# Patient Record
Sex: Male | Born: 1960 | Race: Black or African American | Hispanic: No | Marital: Married | Smoking: Light tobacco smoker
Health system: Southern US, Community
[De-identification: ages and names within clinical notes are randomized; demographics above are authoritative.]

---

## 2006-01-31 ENCOUNTER — Emergency Department (HOSPITAL_COMMUNITY): Admission: EM | Admit: 2006-01-31 | Discharge: 2006-01-31 | Payer: Self-pay | Admitting: Family Medicine

## 2011-01-21 ENCOUNTER — Ambulatory Visit (HOSPITAL_COMMUNITY)
Admission: RE | Admit: 2011-01-21 | Discharge: 2011-01-21 | Payer: Self-pay | Source: Home / Self Care | Attending: Family Medicine | Admitting: Family Medicine

## 2011-09-27 ENCOUNTER — Ambulatory Visit (INDEPENDENT_AMBULATORY_CARE_PROVIDER_SITE_OTHER): Payer: Self-pay

## 2011-09-27 ENCOUNTER — Inpatient Hospital Stay (INDEPENDENT_AMBULATORY_CARE_PROVIDER_SITE_OTHER)
Admission: RE | Admit: 2011-09-27 | Discharge: 2011-09-27 | Disposition: A | Discharge status: Home / Self Care | Payer: Self-pay | Source: Ambulatory Visit | Attending: Family Medicine | Admitting: Family Medicine

## 2011-09-27 DIAGNOSIS — IMO0002 Reserved for concepts with insufficient information to code with codable children: Secondary | ICD-10-CM

## 2013-07-07 ENCOUNTER — Ambulatory Visit: Payer: Self-pay | Attending: Family Medicine | Admitting: Family Medicine

## 2013-07-07 VITALS — BP 143/78 | HR 73 | Temp 98.8°F | Resp 16 | Ht 74.0 in | Wt 225.0 lb

## 2013-07-07 DIAGNOSIS — R030 Elevated blood-pressure reading, without diagnosis of hypertension: Secondary | ICD-10-CM

## 2013-07-07 DIAGNOSIS — Z111 Encounter for screening for respiratory tuberculosis: Secondary | ICD-10-CM | POA: Insufficient documentation

## 2013-07-07 DIAGNOSIS — Z0000 Encounter for general adult medical examination without abnormal findings: Secondary | ICD-10-CM

## 2013-07-07 DIAGNOSIS — Z0001 Encounter for general adult medical examination with abnormal findings: Secondary | ICD-10-CM | POA: Insufficient documentation

## 2013-07-07 DIAGNOSIS — K219 Gastro-esophageal reflux disease without esophagitis: Secondary | ICD-10-CM

## 2013-07-07 NOTE — Progress Notes (Signed)
Patient presents for TB skin test for work.

## 2013-07-07 NOTE — Progress Notes (Signed)
Patient ID: Antonio Snow, male   DOB: 21-Mar-1961, 52 y.o.   MRN: [id number]  CC: need a TB test   HPI: Pt is presenting and requesting that she have a TB skin test done.   No Known Allergies History reviewed. No pertinent past medical history. No current outpatient prescriptions on file prior to visit.   No current facility-administered medications on file prior to visit.   History reviewed. No pertinent family history. History   Social History  . Marital Status: Married    Spouse Name: N/A    Number of Children: N/A  . Years of Education: N/A   Occupational History  . Not on file.   Social History Main Topics  . Smoking status: Light Tobacco Smoker -- 0.25 packs/day    Types: Cigarettes  . Smokeless tobacco: Not on file  . Alcohol Use: No  . Drug Use: No  . Sexually Active: Not on file   Other Topics Concern  . Not on file   Social History Narrative  . No narrative on file    Review of Systems  Constitutional: Negative for fever, chills, diaphoresis, activity change, appetite change and fatigue.  HENT: Negative for ear pain, nosebleeds, congestion, facial swelling, rhinorrhea, neck pain, neck stiffness and ear discharge.   Eyes: Negative for pain, discharge, redness, itching and visual disturbance.  Respiratory: Negative for cough, choking, chest tightness, shortness of breath, wheezing and stridor.   Cardiovascular: Negative for chest pain, palpitations and leg swelling.  Gastrointestinal: Negative for abdominal distention.  Genitourinary: Negative for dysuria, urgency, frequency, hematuria, flank pain, decreased urine volume, difficulty urinating and dyspareunia.  Musculoskeletal: Negative for back pain, joint swelling, arthralgias and gait problem.  Neurological: Negative for dizziness, tremors, seizures, syncope, facial asymmetry, speech difficulty, weakness, light-headedness, numbness and headaches.  Hematological: Negative for adenopathy. Does not  bruise/bleed easily.  Psychiatric/Behavioral: Negative for hallucinations, behavioral problems, confusion, dysphoric mood, decreased concentration and agitation.    Objective:   Filed Vitals:   07/07/13 1512  BP: 143/78  Pulse: 73  Temp: 98.8 F (37.1 C)  Resp: 16    Physical Exam  Constitutional: Appears well-developed and well-nourished. No distress.  HENT: Normocephalic. External right and left ear normal. Oropharynx is clear and moist.  Eyes: Conjunctivae and EOM are normal. PERRLA, no scleral icterus.  Neck: Normal ROM. Neck supple. No JVD. No tracheal deviation. No thyromegaly.  CVS: RRR, S1/S2 +, no murmurs, no gallops, no carotid bruit.  Pulmonary: Effort and breath sounds normal, no stridor, rhonchi, wheezes, rales.  Abdominal: Soft. BS +,  no distension, tenderness, rebound or guarding.  Musculoskeletal: Normal range of motion. No edema and no tenderness.  Lymphadenopathy: No lymphadenopathy noted, cervical, inguinal. Neuro: Alert. Normal reflexes, muscle tone coordination. No cranial nerve deficit. Skin: Skin is warm and dry. No rash noted. Not diaphoretic. No erythema. No pallor.  Psychiatric: Normal mood and affect. Behavior, judgment, thought content normal.   No results found for this basename: WBC, HGB, HCT, MCV, PLT   No results found for this basename: CREATININE, BUN, NA, K, CL, CO2    No results found for this basename: HGBA1C   Lipid Panel  No results found for this basename: chol, trig, hdl, cholhdl, vldl, ldlcalc       Assessment and plan:   Patient Active Problem List   Diagnosis Date Noted  . Elevated blood pressure 07/07/2013  . GERD (gastroesophageal reflux disease) 07/07/2013     PPD  Colonoscopy recommended for health maintenance (  will try to arrange )  Hemoccult cards given to return to office    Set up for CPE   The patient was given clear instructions to go to ER or return to medical center if symptoms don't improve, worsen or  new problems develop.  The patient verbalized understanding.  The patient was told to call to get any lab results if not heard anything in the next week.    Rodney Langton, MD, CDE, FAAFP [facility] [place], [place]

## 2013-07-08 NOTE — Patient Instructions (Addendum)

## 2013-07-09 ENCOUNTER — Ambulatory Visit (HOSPITAL_COMMUNITY)
Admission: RE | Admit: 2013-07-09 | Discharge: 2013-07-09 | Disposition: A | Discharge status: Home / Self Care | Payer: Self-pay | Source: Ambulatory Visit | Attending: Family Medicine | Admitting: Family Medicine

## 2013-07-09 ENCOUNTER — Ambulatory Visit: Payer: Self-pay | Attending: Family Medicine

## 2013-07-09 DIAGNOSIS — R7611 Nonspecific reaction to tuberculin skin test without active tuberculosis: Secondary | ICD-10-CM | POA: Insufficient documentation

## 2013-07-09 LAB — TB SKIN TEST: TB Skin Test: POSITIVE

## 2013-07-09 NOTE — Progress Notes (Signed)
Pt here for PPD reading in right arm. positve read noted. 1mm red,raised. Pt to get chest xray f/u

## 2013-07-09 NOTE — Progress Notes (Signed)
Quick Note:  Please inform patient that chest xray results reveal: No active cardiopulmonary disease. No change from prior xrays.   Rodney Langton, MD, CDE, FAAFP [facility] [place], [place]   ______

## 2013-07-12 ENCOUNTER — Telehealth: Payer: Self-pay | Admitting: *Deleted

## 2013-07-12 NOTE — Telephone Encounter (Signed)
07/12/13 Patient made aware of  chest X-ray results reveal no active cardiopulmonary disease.  Sherlene Shams BSN [facility]

## 2013-09-15 ENCOUNTER — Encounter: Payer: Self-pay | Admitting: Family Medicine

## 2016-08-05 ENCOUNTER — Ambulatory Visit (HOSPITAL_COMMUNITY)
Admission: EM | Admit: 2016-08-05 | Discharge: 2016-08-05 | Disposition: A | Discharge status: Home / Self Care | Payer: 59 | Attending: Family Medicine | Admitting: Family Medicine

## 2016-08-05 ENCOUNTER — Encounter (HOSPITAL_COMMUNITY): Payer: Self-pay | Admitting: Emergency Medicine

## 2016-08-05 DIAGNOSIS — K047 Periapical abscess without sinus: Secondary | ICD-10-CM

## 2016-08-05 MED ORDER — CEFTRIAXONE SODIUM 1 G IJ SOLR
1.0000 g | Freq: Once | INTRAMUSCULAR | Status: AC
  Administered 2016-08-05: 1 g via INTRAMUSCULAR

## 2016-08-05 MED ORDER — HYDROCODONE-ACETAMINOPHEN 5-325 MG PO TABS: 2.0000 | ORAL_TABLET | ORAL | 0 refills | Status: DC | PRN

## 2016-08-05 MED ORDER — CLINDAMYCIN HCL 300 MG PO CAPS: 300.0000 mg | ORAL_CAPSULE | Freq: Three times a day (TID) | ORAL | 0 refills | Status: DC

## 2016-08-05 MED ORDER — CEFTRIAXONE SODIUM 1 G IJ SOLR
INTRAMUSCULAR | Status: AC
  Filled 2016-08-05: qty 10

## 2016-08-05 MED ORDER — LIDOCAINE HCL (PF) 1 % IJ SOLN
INTRAMUSCULAR | Status: AC
  Filled 2016-08-05: qty 2

## 2016-08-05 NOTE — ED Triage Notes (Signed)
Patient has toothache for 2 weeks.  Patient has been using salt water rinses.

## 2016-08-05 NOTE — ED Provider Notes (Signed)
CSN: [id number]     Arrival date & time 08/05/16  1806 History   None    No chief complaint on file.  (Consider location/radiation/quality/duration/timing/severity/associated sxs/prior Treatment) Patient is having dental pain and abscess.  He is having low grade fever.   The history is provided by the patient.  Dental Pain  Location:  Upper Upper teeth location:  9/LU central incisor and 8/RU central incisor Quality:  Pulsating Severity:  Severe Onset quality:  Sudden Duration:  2 days Timing:  Constant Progression:  Worsening Chronicity:  New Context: abscess   Relieved by:  Nothing Worsened by:  Nothing Associated symptoms: fever     No past medical history on file. No past surgical history on file. No family history on file. Social History  Substance Use Topics  . Smoking status: Light Tobacco Smoker    Packs/day: 0.25    Types: Cigarettes  . Smokeless tobacco: Not on file  . Alcohol use No    Review of Systems  Constitutional: Positive for fever.  HENT: Positive for dental problem.   Eyes: Negative.   Respiratory: Negative.   Cardiovascular: Negative.   Gastrointestinal: Negative.   Endocrine: Negative.   Genitourinary: Negative.   Musculoskeletal: Negative.   Skin: Negative.   Neurological: Negative.   Hematological: Negative.   Psychiatric/Behavioral: Negative.     Allergies  Review of patient's allergies indicates no known allergies.  Home Medications   Prior to Admission medications   Not on File   Meds Ordered and Administered this Visit  Medications - No data to display  BP 138/64 (BP Location: Right Arm)   Pulse 84   Temp 100.1 F (37.8 C) (Oral)   SpO2 97%  No data found.   Physical Exam  Constitutional: He appears well-developed and well-nourished.  HENT:  Head: Normocephalic and atraumatic.  Upper left central incisor with abcess and draining purulence.  Eyes: EOM are normal. Pupils are equal, round, and reactive to light.   Neck: Normal range of motion. Neck supple.  Cardiovascular: Normal rate and regular rhythm.   Pulmonary/Chest: Effort normal and breath sounds normal.  Abdominal: Soft. Bowel sounds are normal.  Nursing note and vitals reviewed.   Urgent Care Course   Clinical Course    Procedures (including critical care time)  Labs Review Labs Reviewed - No data to display  Imaging Review No results found.   Visual Acuity Review  Right Eye Distance:   Left Eye Distance:   Bilateral Distance:    Right Eye Near:   Left Eye Near:    Bilateral Near:         MDM  Dental abscess - Rocephin 1 gram IM, norco 5/325 one po q 6 hours prn #6 Clindamycin 300mg  one po tid x 10 days #30    Deatra CanterWilliam J Olisa Quesnel, FNP 08/05/16 1946

## 2018-01-26 ENCOUNTER — Encounter: Payer: Self-pay | Admitting: Family Medicine

## 2018-01-26 ENCOUNTER — Ambulatory Visit (INDEPENDENT_AMBULATORY_CARE_PROVIDER_SITE_OTHER): Payer: Self-pay | Admitting: Family Medicine

## 2018-01-26 VITALS — BP 110/80 | HR 68 | Ht 74.0 in | Wt 233.4 lb

## 2018-01-26 DIAGNOSIS — B356 Tinea cruris: Secondary | ICD-10-CM

## 2018-01-26 DIAGNOSIS — B351 Tinea unguium: Secondary | ICD-10-CM

## 2018-01-26 DIAGNOSIS — R351 Nocturia: Secondary | ICD-10-CM

## 2018-01-26 DIAGNOSIS — K921 Melena: Secondary | ICD-10-CM | POA: Insufficient documentation

## 2018-01-26 DIAGNOSIS — Z0001 Encounter for general adult medical examination with abnormal findings: Secondary | ICD-10-CM

## 2018-01-26 DIAGNOSIS — B353 Tinea pedis: Secondary | ICD-10-CM

## 2018-01-26 DIAGNOSIS — M25561 Pain in right knee: Secondary | ICD-10-CM

## 2018-01-26 DIAGNOSIS — B352 Tinea manuum: Secondary | ICD-10-CM

## 2018-01-26 DIAGNOSIS — N401 Enlarged prostate with lower urinary tract symptoms: Secondary | ICD-10-CM

## 2018-01-26 DIAGNOSIS — G8929 Other chronic pain: Secondary | ICD-10-CM

## 2018-01-26 LAB — URINALYSIS, ROUTINE W REFLEX MICROSCOPIC
BILIRUBIN URINE: NEGATIVE
KETONES UR: NEGATIVE
Leukocytes, UA: NEGATIVE
Nitrite: NEGATIVE
PH: 6 (ref 5.0–8.0)
Specific Gravity, Urine: 1.02 (ref 1.000–1.030)
Total Protein, Urine: NEGATIVE
UROBILINOGEN UA: 0.2 (ref 0.0–1.0)
Urine Glucose: NEGATIVE

## 2018-01-26 LAB — LIPID PANEL
Cholesterol: 145 mg/dL (ref 0–200)
HDL: 56.4 mg/dL (ref >39.00)
LDL CALC: 75 mg/dL (ref 0–99)
NonHDL: 88.43
Total CHOL/HDL Ratio: 3
Triglycerides: 67 mg/dL (ref 0.0–149.0)
VLDL: 13.4 mg/dL (ref 0.0–40.0)

## 2018-01-26 LAB — COMPREHENSIVE METABOLIC PANEL
ALBUMIN: 4.2 g/dL (ref 3.5–5.2)
ALK PHOS: 52 U/L (ref 39–117)
ALT: 17 U/L (ref 0–53)
AST: 18 U/L (ref 0–37)
BUN: 17 mg/dL (ref 6–23)
CHLORIDE: 106 meq/L (ref 96–112)
CO2: 29 mEq/L (ref 19–32)
Calcium: 9.2 mg/dL (ref 8.4–10.5)
Creatinine, Ser: 1.11 mg/dL (ref 0.40–1.50)
GFR: 87.82 mL/min (ref >60.00)
Glucose, Bld: 110 mg/dL — ABNORMAL HIGH (ref 70–99)
POTASSIUM: 4.3 meq/L (ref 3.5–5.1)
Sodium: 143 mEq/L (ref 135–145)
TOTAL PROTEIN: 7.1 g/dL (ref 6.0–8.3)
Total Bilirubin: 0.4 mg/dL (ref 0.2–1.2)

## 2018-01-26 LAB — CBC
HEMATOCRIT: 41 % (ref 39.0–52.0)
HEMOGLOBIN: 13.4 g/dL (ref 13.0–17.0)
MCHC: 32.8 g/dL (ref 30.0–36.0)
MCV: 90.2 fl (ref 78.0–100.0)
Platelets: 233 10*3/uL (ref 150.0–400.0)
RBC: 4.54 Mil/uL (ref 4.22–5.81)
RDW: 13.1 % (ref 11.5–15.5)
WBC: 4.6 10*3/uL (ref 4.0–10.5)

## 2018-01-26 LAB — TSH: TSH: 0.76 u[IU]/mL (ref 0.35–4.50)

## 2018-01-26 LAB — PSA: PSA: 2.38 ng/mL (ref 0.10–4.00)

## 2018-01-26 MED ORDER — TERBINAFINE HCL 250 MG PO TABS: 250.0000 mg | ORAL_TABLET | Freq: Every day | ORAL | 1 refills | Status: DC

## 2018-01-26 MED ORDER — KETOCONAZOLE 2 % EX CREA: TOPICAL_CREAM | CUTANEOUS | 1 refills | Status: DC

## 2018-01-26 NOTE — Progress Notes (Signed)
Subjective:  Patient ID: Antonio Snow, male    DOB: May 20, 1961  Age: 57 y.o. MRN: [id number]  CC: Establish Care   HPI Antonio Snow presents for establishment of care, physical exam and has several complaints.  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  He is engaged.  He smokes 1-2 cigarettes daily and has an occasional cigar.  Rarely drinks alcohol and does not use illicit drugs.  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  His mother is 95.  She lives independently.  His father is past and had prostate cancer.  Patient is having a rash in his groin, toenails are tender, and he has noticed some blood in his stool.  He had a colonoscopy when he was 48 and was asked to return in 10 years.  He has some urinary hesitancy and nocturia x4.  He admits to drinking a lot of water before bedtime.  His right knee has been sore for some time now.  History Antonio Snow has no past medical history on file.   He has no past surgical history on file.   His family history includes Cancer in his father.He reports that he has been smoking cigarettes.  He has been smoking about 0.25 packs per day. he has never used smokeless tobacco. He reports that he does not drink alcohol or use drugs.  Outpatient Medications Prior to Visit  Medication Sig Dispense Refill  . clindamycin (CLEOCIN) 300 MG capsule Take 1 capsule (300 mg total) by mouth 3 (three) times daily. 30 capsule 0  . HYDROcodone-acetaminophen (NORCO/VICODIN) 5-325 MG tablet Take 2 tablets by mouth every 4 (four) hours as needed. 6 tablet 0   No facility-administered medications prior to visit.     ROS Review of Systems  Constitutional: Negative.   HENT: Negative.   Eyes: Negative for photophobia and visual disturbance.  Respiratory: Negative for cough, shortness of breath and wheezing.   Cardiovascular: Negative.   Gastrointestinal: Positive for blood in stool. Negative for abdominal pain, anal bleeding, constipation, nausea  and vomiting.  Endocrine: Negative for polyphagia and polyuria.  Genitourinary: Positive for difficulty urinating. Negative for dysuria and hematuria.  Musculoskeletal: Positive for arthralgias.  Skin: Positive for color change and rash.  Allergic/Immunologic: Negative for immunocompromised state.  Neurological: Negative for weakness, numbness and headaches.  Hematological: Does not bruise/bleed easily.  Psychiatric/Behavioral: Negative.     Objective:  BP 110/80 (BP Location: Left Arm, Patient Position: Sitting, Cuff Size: Normal)   Pulse 68   Ht 6\' 2"  (1.88 m)   Wt 233 lb 6 oz (105.9 kg)   SpO2 99%   BMI 29.96 kg/m   Physical Exam  Constitutional: He is oriented to person, place, and time. He appears well-developed and well-nourished. No distress.  HENT:  Head: Normocephalic and atraumatic.  Right Ear: External ear normal.  Left Ear: External ear normal.  Mouth/Throat: Oropharynx is clear and moist. No oropharyngeal exudate.  Eyes: Conjunctivae are normal. Pupils are equal, round, and reactive to light. Right eye exhibits no discharge. Left eye exhibits no discharge. No scleral icterus.  Neck: Neck supple. No JVD present. No tracheal deviation present. No thyromegaly present.  Cardiovascular: Normal rate, regular rhythm and normal heart sounds.  Pulmonary/Chest: Effort normal and breath sounds normal. No respiratory distress. He has no wheezes. He has no rales.  Abdominal: Bowel sounds are normal. He exhibits no distension. There is no tenderness. There is no rebound and no guarding. Hernia confirmed  negative in the right inguinal area and confirmed negative in the left inguinal area.  Genitourinary: Testes normal and penis normal. Rectal exam shows external hemorrhoid. Rectal exam shows no internal hemorrhoid, no fissure, no mass, no tenderness, anal tone normal and guaiac negative stool. Prostate is enlarged. Prostate is not tender. Right testis shows no mass, no swelling and no  tenderness. Left testis shows no mass, no swelling and no tenderness. Circumcised. No penile erythema or penile tenderness. No discharge found.  Musculoskeletal:       Right knee: He exhibits swelling.  Lymphadenopathy:    He has no cervical adenopathy.       Right: No inguinal adenopathy present.       Left: No inguinal adenopathy present.  Neurological: He is alert and oriented to person, place, and time.  Skin: Skin is warm and dry. He is not diaphoretic.  Psychiatric: He has a normal mood and affect.      Assessment & Plan:   Antonio Snow was seen today for establish care.  Diagnoses and all orders for this visit:  Encounter for health maintenance examination with abnormal findings -     CBC -     Comprehensive metabolic panel -     Hepatitis C antibody -     HIV antibody -     Lipid panel -     PSA -     TSH -     Urinalysis, Routine w reflex microscopic  Tinea manuum, pedis, and unguium -     Comprehensive metabolic panel -     terbinafine (LAMISIL) 250 MG tablet; Take 1 tablet (250 mg total) by mouth daily.  Tinea cruris -     ketoconazole (NIZORAL) 2 % cream; Apply to rash on groin daily for 2 weeks.  Chronic pain of right knee  Hematochezia -     CBC -     Ambulatory referral to Gastroenterology  BPH associated with nocturia -     PSA -     Urinalysis, Routine w reflex microscopic   I have discontinued Antonio Snow HYDROcodone-acetaminophen and clindamycin. I am also having him start on terbinafine and ketoconazole.  Meds ordered this encounter  Medications  . terbinafine (LAMISIL) 250 MG tablet    Sig: Take 1 tablet (250 mg total) by mouth daily.    Dispense:  30 tablet    Refill:  1  . ketoconazole (NIZORAL) 2 % cream    Sig: Apply to rash on groin daily for 2 weeks.    Dispense:  30 g    Refill:  1   .  Patient on Lamisil as well for his respective infections.  Urged this patient to go ahead and quit smoking.  Discussed using a medicine for  his luts symptoms but for now I advised him to stop all fluid intake after 8:00 at night.  He will return in a month for follow-up and I will look at his right knee at that time.  Follow-up: Return in about 1 month (around 02/23/2018).  Mliss Sax, MD

## 2018-01-26 NOTE — Patient Instructions (Addendum)
Athlete's Foot Athlete's foot (tinea pedis) is a fungal infection of the skin on the feet. It often occurs on the skin that is between or underneath the toes. It can also occur on the soles of the feet. The infection can spread from person to person (is contagious). What are the causes? Athlete's foot is caused by a fungus. This fungus grows in warm, moist places. Most people get athlete's foot by sharing shower stalls, towels, and wet floors with someone who is infected. Not washing your feet or changing your socks often enough can contribute to athlete's foot. What increases the risk? This condition is more likely to develop in:  Men.  People who have a weak body defense system (immune system).  People who have diabetes.  People who use public showers, such as at a gym.  People who wear heavy-duty shoes, such as Youth worker.  Seasons with warm, humid weather.  What are the signs or symptoms? Symptoms of this condition include:  Itchy areas between the toes or on the soles of the feet.  White, flaky, or scaly areas between the toes or on the soles of the feet.  Very itchy small blisters between the toes or on the soles of the feet.  Small cuts on the skin. These cuts can become infected.  Thick or discolored toenails.  How is this diagnosed? This condition is diagnosed with a medical history and physical exam. Your health care provider may also take a skin or toenail sample to be examined. How is this treated? Treatment for this condition includes antifungal medicines. These may be applied as powders, ointments, or creams. In severe cases, an oral antifungal medicine may be given. Follow these instructions at home:  Apply or take over-the-counter and prescription medicines only as told by your health care provider.  Keep all follow-up visits as told by your health care provider. This is important.  Do not scratch your feet.  Keep your feet dry: ? Wear  cotton or wool socks. Change your socks every day or if they become wet. ? Wear shoes that allow air to circulate, such as sandals or canvas tennis shoes.  Wash and dry your feet: ? Every day or as told by your health care provider. ? After exercising. ? Including the area between your toes.  Do not share towels, nail clippers, or other personal items that touch your feet with others.  If you have diabetes, keep your blood sugar under control. How is this prevented?  Do not share towels.  Wear sandals in wet areas, such as locker rooms and shared showers.  Keep your feet dry: ? Wear cotton or wool socks. Change your socks every day or if they become wet. ? Wear shoes that allow air to circulate, such as sandals or canvas tennis shoes.  Wash and dry your feet after exercising. Pay attention to the area between your toes. Contact a health care provider if:  You have a fever.  You have swelling, soreness, warmth, or redness in your foot.  You are not getting better with treatment.  Your symptoms get worse.  You have new symptoms. This information is not intended to replace advice given to you by your health care provider. Make sure you discuss any questions you have with your health care provider. Document Released: 12/06/2000 Document Revised: 05/16/2016 Document Reviewed: 06/12/2015 Elsevier Interactive Patient Education  2018 [employer]. Terbinafine tablets What is this medicine? TERBINAFINE (TER bin a feen) is an antifungal  medicine. It is used to treat certain kinds of fungal or yeast infections. This medicine may be used for other purposes; ask your health care provider or pharmacist if you have questions. COMMON BRAND NAME(S): Lamisil, Terbinex What should I tell my health care provider before I take this medicine? They need to know if you have any of these conditions: -drink alcoholic beverages -kidney disease -liver disease -an unusual or allergic reaction to  terbinafine, other medicines, foods, dyes, or preservatives -pregnant or trying to get pregnant -breast-feeding How should I use this medicine? Take this medicine by mouth with a full glass of water. Follow the directions on the prescription label. You can take this medicine with food or on an empty stomach. Take your medicine at regular intervals. Do not take your medicine more often than directed. Do not skip doses or stop your medicine early even if you feel better. Do not stop taking except on your doctor's advice. Talk to your pediatrician regarding the use of this medicine in children. Special care may be needed. Overdosage: If you think you have taken too much of this medicine contact a poison control center or emergency room at once. NOTE: This medicine is only for you. Do not share this medicine with others. What if I miss a dose? If you miss a dose, take it as soon as you can. If it is almost time for your next dose, take only that dose. Do not take double or extra doses. What may interact with this medicine? Do not take this medicine with any of the following medications: -thioridazine This medicine may also interact with the following medications: -beta-blockers -caffeine -cimetidine -cyclosporine -medicines for depression, anxiety, or psychotic disturbances -medicines for fungal infections like fluconazole and ketoconazole -medicines for irregular heartbeat like amiodarone, flecainide and propafenone -rifampin -warfarin This list may not describe all possible interactions. Give your health care provider a list of all the medicines, herbs, non-prescription drugs, or dietary supplements you use. Also tell them if you smoke, drink alcohol, or use illegal drugs. Some items may interact with your medicine. What should I watch for while using this medicine? Visit your doctor or health care provider regularly. Tell your doctor right away if you have nausea or vomiting, loss of appetite,  stomach pain on your right upper side, yellow skin, dark urine, light stools, or are over tired. Some fungal infections need many weeks or months of treatment to cure. If you are taking this medicine for a long time, you will need to have important blood work done. What side effects may I notice from receiving this medicine? Side effects that you should report to your doctor or health care professional as soon as possible: -allergic reactions like skin rash or hives, swelling of the face, lips, or tongue -changes in vision -dark urine -fever or infection -general ill feeling or flu-like symptoms -light-colored stools -loss of appetite, nausea -redness, blistering, peeling or loosening of the skin, including inside the mouth -right upper belly pain -unusually weak or tired -yellowing of the eyes or skin Side effects that usually do not require medical attention (report to your doctor or health care professional if they continue or are bothersome): -changes in taste -diarrhea -hair loss -muscle or joint pain -stomach gas -stomach upset This list may not describe all possible side effects. Call your doctor for medical advice about side effects. You may report side effects to FDA at 1-800-FDA-1088. Where should I keep my medicine? Keep out of the reach of  children. Store at room temperature below 25 degrees C (77 degrees F). Protect from light. Throw away any unused medicine after the expiration date. NOTE: This sheet is a summary. It may not cover all possible information. If you have questions about this medicine, talk to your doctor, pharmacist, or health care provider.  2018 Elsevier/Gold Standard (2008-02-19 16:28:07)  Fungal Nail Infection Fungal nail infection is a common fungal infection of the toenails or fingernails. This condition affects toenails more often than fingernails. More than one nail may be infected. The condition can be passed from person to person (is  contagious). What are the causes? This condition is caused by a fungus. Several types of funguses can cause the infection. These funguses are common in moist and warm areas. If your hands or feet come into contact with the fungus, it may get into a crack in your fingernail or toenail and cause the infection. What increases the risk? The following factors may make you more likely to develop this condition:  Being male.  Having diabetes.  Being of older age.  Living with someone who has the fungus.  Walking barefoot in areas where the fungus thrives, such as showers or locker rooms.  Having poor circulation.  Wearing shoes and socks that cause your feet to sweat.  Having athlete's foot.  Having a nail injury or history of a recent nail surgery.  Having psoriasis.  Having a weak body defense system (immune system).  What are the signs or symptoms? Symptoms of this condition include:  A pale spot on the nail.  Thickening of the nail.  A nail that becomes yellow or brown.  A brittle or ragged nail edge.  A crumbling nail.  A nail that has lifted away from the nail bed.  How is this diagnosed? This condition is diagnosed with a physical exam. Your health care provider may take a scraping or clipping from your nail to test for the fungus. How is this treated? Mild infections do not need treatment. If you have significant nail changes, treatment may include:  Oral antifungal medicines. You may need to take the medicine for several weeks or several months, and you may not see the results for a long time. These medicines can cause side effects. Ask your health care provider what problems to watch for.  Antifungal nail polish and nail cream. These may be used along with oral antifungal medicines.  Laser treatment of the nail.  Surgery to remove the nail. This may be needed for the most severe infections.  Treatment takes a long time, and the infection may come  back. Follow these instructions at home: Medicines  Take or apply over-the-counter and prescription medicines only as told by your health care provider.  Ask your health care provider about using over-the-counter mentholated ointment on your nails. Lifestyle   Do not share personal items, such as towels or nail clippers.  Trim your nails often.  Wash and dry your hands and feet every day.  Wear absorbent socks, and change your socks frequently.  Wear shoes that allow air to circulate, such as sandals or canvas tennis shoes. Throw out old shoes.  Wear rubber gloves if you are working with your hands in wet areas.  Do not walk barefoot in shower rooms or locker rooms.  Do not use a nail salon that does not use clean instruments.  Do not use artificial nails. General instructions  Keep all follow-up visits as told by your health care provider. This  is important.  Use antifungal foot powder on your feet and in your shoes. Contact a health care provider if: Your infection is not getting better or it is getting worse after several months. This information is not intended to replace advice given to you by your health care provider. Make sure you discuss any questions you have with your health care provider. Document Released: 12/06/2000 Document Revised: 05/16/2016 Document Reviewed: 06/12/2015 Elsevier Interactive Patient Education  2018 [employer]

## 2018-01-27 LAB — HIV ANTIBODY (ROUTINE TESTING W REFLEX): HIV: NONREACTIVE

## 2018-01-27 LAB — HEPATITIS C ANTIBODY
HEP C AB: NONREACTIVE
SIGNAL TO CUT-OFF: 0.01 (ref <1.00)

## 2018-01-28 ENCOUNTER — Other Ambulatory Visit: Payer: Self-pay

## 2018-01-28 DIAGNOSIS — R8290 Unspecified abnormal findings in urine: Secondary | ICD-10-CM

## 2018-03-26 ENCOUNTER — Encounter: Payer: Self-pay | Admitting: Family Medicine

## 2018-04-28 ENCOUNTER — Ambulatory Visit (HOSPITAL_COMMUNITY)
Admission: EM | Admit: 2018-04-28 | Discharge: 2018-04-28 | Disposition: A | Discharge status: Home / Self Care | Payer: Self-pay | Attending: Family Medicine | Admitting: Family Medicine

## 2018-04-28 ENCOUNTER — Encounter (HOSPITAL_COMMUNITY): Payer: Self-pay | Admitting: Emergency Medicine

## 2018-04-28 DIAGNOSIS — M25561 Pain in right knee: Secondary | ICD-10-CM

## 2018-04-28 DIAGNOSIS — G8929 Other chronic pain: Secondary | ICD-10-CM

## 2018-04-28 MED ORDER — MELOXICAM 15 MG PO TABS: 15.0000 mg | ORAL_TABLET | Freq: Every day | ORAL | 0 refills | Status: DC

## 2018-04-28 NOTE — ED Triage Notes (Signed)
Pt sts chronic right knee pain; pt sts standing at work makes more severe

## 2018-04-28 NOTE — ED Provider Notes (Signed)
[facility]    CSN: [id number] Arrival date & time: 04/28/18  1002     History   Chief Complaint Chief Complaint  Patient presents with  . Knee Pain    HPI Antonio Snow is a 57 y.o. male.   57 year old male comes in for chronic right knee pain.  States has been gradually worsening throughout the past 5 to 6 months, but has not had time to follow-up until now.  He denies any injury/trauma.  States current job requires long hours of standing, and notices worsening during these periods of time.  Has had swelling to the knee with decreased extension and flexion due to pain and swelling.  Pain mostly at the medial side of the knee.  Denies fever, chills, night sweats.  States  when sleeping with both knees together, can have pain radiating from the ankle up to the knee.  States Tylenol with some improvement.     History reviewed. No pertinent past medical history.  Patient Active Problem List   Diagnosis Date Noted  . Tinea manuum, pedis, and unguium 01/26/2018  . Tinea cruris 01/26/2018  . Chronic pain of right knee 01/26/2018  . Hematochezia 01/26/2018  . BPH associated with nocturia 01/26/2018  . Elevated blood pressure 07/07/2013  . GERD (gastroesophageal reflux disease) 07/07/2013  . Encounter for health maintenance examination with abnormal findings 07/07/2013    History reviewed. No pertinent surgical history.     Home Medications    Prior to Admission medications   Medication Sig Start Date End Date Taking? Authorizing Provider  ketoconazole (NIZORAL) 2 % cream Apply to rash on groin daily for 2 weeks. 01/26/18   Mliss Sax, MD  meloxicam (MOBIC) 15 MG tablet Take 1 tablet (15 mg total) by mouth daily. 04/28/18   Cathie Hoops, Nilton Lave V, PA-C  terbinafine (LAMISIL) 250 MG tablet Take 1 tablet (250 mg total) by mouth daily. 01/26/18   Mliss Sax, MD    Family History Family History  Problem Relation Age of Onset  . Cancer Father     Social  History Social History   Tobacco Use  . Smoking status: Light Tobacco Smoker    Packs/day: 0.25    Types: Cigarettes  . Smokeless tobacco: Never Used  Substance Use Topics  . Alcohol use: No  . Drug use: No     Allergies   Patient has no known allergies.   Review of Systems Review of Systems  Reason unable to perform ROS: See HPI as above.     Physical Exam Triage Vital Signs ED Triage Vitals [04/28/18 1023]  Enc Vitals Group     BP 135/82     Pulse Rate 74     Resp 18     Temp 98.3 F (36.8 C)     Temp Source Oral     SpO2 95 %     Weight      Height      Head Circumference      Peak Flow      Pain Score      Pain Loc      Pain Edu?      Excl. in GC?    No data found.  Updated Vital Signs BP 135/82 (BP Location: Right Arm)   Pulse 74   Temp 98.3 F (36.8 C) (Oral)   Resp 18   SpO2 95%   Physical Exam  Constitutional: He is oriented to person, place, and time. He appears well-developed  and well-nourished. No distress.  HENT:  Head: Normocephalic and atraumatic.  Eyes: Pupils are equal, round, and reactive to light. Conjunctivae are normal.  Musculoskeletal:  No obvious swelling of the knee. No erythema, increased warmth. Tender to palpation of medial joint line. No tenderness to palpation of the ankle/lower leg. Full ROM of knee, though with pain at flexion. Strength normal and equal bilaterally. Sensation intact and equal bilaterally. Pedal pulse 2+.   Neurological: He is alert and oriented to person, place, and time.     UC Treatments / Results  Labs (all labs ordered are listed, but only abnormal results are displayed) Labs Reviewed - No data to display  EKG None  Radiology No results found.  Procedures Procedures (including critical care time)  Medications Ordered in UC Medications - No data to display  Initial Impression / Assessment and Plan / UC Course  I have reviewed the triage vital signs and the nursing notes.  Pertinent  labs & imaging results that were available during my care of the patient were reviewed by me and considered in my medical decision making (see chart for details).    5-6 month history of atraumatic knee pain, discussed with patient, no indications of xray right now. Will start mobic, ice compress, elevation, knee sleeve during activity. Follow up with PCP for further evaluation and management needed. Patient expresses understanding and agrees to plan.  Final Clinical Impressions(s) / UC Diagnoses   Final diagnoses:  Chronic pain of right knee    ED Prescriptions    Medication Sig Dispense Auth. Provider   meloxicam (MOBIC) 15 MG tablet Take 1 tablet (15 mg total) by mouth daily. 15 tablet Threasa Alpha, [place] 04/28/18 1121

## 2018-04-28 NOTE — Discharge Instructions (Signed)
Start Mobic as directed. Ice compress, elevation, knee sleeve during activity. Follow up with PCP for further management and evaluation needed.

## 2019-01-28 ENCOUNTER — Ambulatory Visit (HOSPITAL_COMMUNITY)
Admission: EM | Admit: 2019-01-28 | Discharge: 2019-01-28 | Disposition: A | Discharge status: Home / Self Care | Payer: BLUE CROSS/BLUE SHIELD | Attending: Family Medicine | Admitting: Family Medicine

## 2019-01-28 ENCOUNTER — Other Ambulatory Visit: Payer: Self-pay

## 2019-01-28 ENCOUNTER — Encounter (HOSPITAL_COMMUNITY): Payer: Self-pay

## 2019-01-28 DIAGNOSIS — L600 Ingrowing nail: Secondary | ICD-10-CM

## 2019-01-28 DIAGNOSIS — L089 Local infection of the skin and subcutaneous tissue, unspecified: Secondary | ICD-10-CM

## 2019-01-28 MED ORDER — CEPHALEXIN 500 MG PO CAPS: 500.0000 mg | ORAL_CAPSULE | Freq: Four times a day (QID) | ORAL | 0 refills | Status: AC

## 2019-01-28 NOTE — Discharge Instructions (Addendum)
We will go ahead and treat you for infection of the finger. Ibuprofen for pain and inflammation.  Follow up with podiatry for ingrown toenail  Follow up as needed for continued or worsening symptoms

## 2019-01-28 NOTE — ED Triage Notes (Signed)
Pt cc left hand index finger pain. Pt states he puncture his nail with something not sure what. Pt has right ear discomfort. Left foot big toe  pain.

## 2019-01-31 NOTE — ED Provider Notes (Signed)
[facility]    CSN: [id number] Arrival date & time: 01/28/19  1059     History   Chief Complaint Chief Complaint  Patient presents with  . finger infected    HPI Antonio Snow is a 58 y.o. male.   Pt is a 58 year old male that presents with left index finger pain.  Reports that he punctured his finger with something approximate 1 week ago and since has been swollen and painful.  Symptoms have been constant worsening.  He is not taking any for symptoms.  Denies any fevers, chills, myalgias.  Good range of motion of the finger. He is also having some right ear discomfort this is been waxing waning for a while.  He describes it as an itching sensation.  Denies any pain.  Denies any drainage from the ear. He is also having left great toe discomfort and discoloration at the toenail.  This is been going on for a very long time.  He treated himself for onychomycosis in the past.  This does not seem to help.  Now the toenail is becoming very painful more around the medial aspect.  Mild swelling.  No erythema.  No drainage.  ROS per HPI     History reviewed. No pertinent past medical history.  Patient Active Problem List   Diagnosis Date Noted  . Tinea manuum, pedis, and unguium 01/26/2018  . Tinea cruris 01/26/2018  . Chronic pain of right knee 01/26/2018  . Hematochezia 01/26/2018  . BPH associated with nocturia 01/26/2018  . Elevated blood pressure 07/07/2013  . GERD (gastroesophageal reflux disease) 07/07/2013  . Encounter for health maintenance examination with abnormal findings 07/07/2013    History reviewed. No pertinent surgical history.     Home Medications    Prior to Admission medications   Medication Sig Start Date End Date Taking? Authorizing Provider  cephALEXin (KEFLEX) 500 MG capsule Take 1 capsule (500 mg total) by mouth 4 (four) times daily for 7 days. 01/28/19 02/04/19  Dahlia Byes A, NP  ketoconazole (NIZORAL) 2 % cream Apply to rash on groin  daily for 2 weeks. 01/26/18   Mliss Sax, MD  meloxicam (MOBIC) 15 MG tablet Take 1 tablet (15 mg total) by mouth daily. 04/28/18   Cathie Hoops, Amy V, PA-C  terbinafine (LAMISIL) 250 MG tablet Take 1 tablet (250 mg total) by mouth daily. 01/26/18   Mliss Sax, MD    Family History Family History  Problem Relation Age of Onset  . Cancer Father     Social History Social History   Tobacco Use  . Smoking status: Light Tobacco Smoker    Packs/day: 0.25    Types: Cigarettes  . Smokeless tobacco: Never Used  Substance Use Topics  . Alcohol use: No  . Drug use: No     Allergies   Patient has no known allergies.   Review of Systems Review of Systems   Physical Exam Triage Vital Signs ED Triage Vitals  Enc Vitals Group     BP 01/28/19 1139 121/78     Pulse Rate 01/28/19 1139 60     Resp 01/28/19 1139 18     Temp 01/28/19 1139 98.9 F (37.2 C)     Temp Source 01/28/19 1139 Oral     SpO2 01/28/19 1139 99 %     Weight 01/28/19 1143 225 lb (102.1 kg)     Height --      Head Circumference --  Peak Flow --      Pain Score 01/28/19 1143 7     Pain Loc --      Pain Edu? --      Excl. in GC? --    No data found.  Updated Vital Signs BP 121/78 (BP Location: Left Arm)   Pulse 60   Temp 98.9 F (37.2 C) (Oral)   Resp 18   Wt 225 lb (102.1 kg)   SpO2 99%   BMI 28.89 kg/m   Visual Acuity Right Eye Distance:   Left Eye Distance:   Bilateral Distance:    Right Eye Near:   Left Eye Near:    Bilateral Near:     Physical Exam Constitutional:      General: He is not in acute distress.    Appearance: Normal appearance. He is not ill-appearing, toxic-appearing or diaphoretic.  HENT:     Right Ear: Tympanic membrane and ear canal normal.     Left Ear: Tympanic membrane and ear canal normal.     Ears:     Comments: Wax and long black coarse  hair noted in the right ear canal.     Nose: Nose normal.     Mouth/Throat:     Pharynx: Oropharynx is clear.    Eyes:     Conjunctiva/sclera: Conjunctivae normal.  Neck:     Musculoskeletal: Normal range of motion.  Cardiovascular:     Rate and Rhythm: Normal rate and regular rhythm.     Pulses: Normal pulses.     Heart sounds: Normal heart sounds.  Pulmonary:     Effort: Pulmonary effort is normal.     Breath sounds: Normal breath sounds.  Musculoskeletal: Normal range of motion.        General: Swelling and tenderness present.     Comments: Swelling and tenderness to the pad of the left index finger. Small healed puncture wound. swelling does not circle around to the nail bed. Good ROM.  Darkening of the left great toe. Mild swelling and ingrown toenail.   Skin:    General: Skin is warm and dry.  Neurological:     Mental Status: He is alert.  Psychiatric:        Mood and Affect: Mood normal.      UC Treatments / Results  Labs (all labs ordered are listed, but only abnormal results are displayed) Labs Reviewed - No data to display  EKG None  Radiology No results found.  Procedures Procedures (including critical care time)  Medications Ordered in UC Medications - No data to display  Initial Impression / Assessment and Plan / UC Course  I have reviewed the triage vital signs and the nursing notes.  Pertinent labs & imaging results that were available during my care of the patient were reviewed by me and considered in my medical decision making (see chart for details).     We will go ahead and treat for possible infection to the left index finger due to swelling and pain. He can take ibuprofen for pain inflammation Keflex to treat the infection Was told to follow with podiatry for the ingrown toenail and possible removal He can keep using the Lamisil as he has been doing for the toe nail fungus.  Course black hair removed from right ear canal Patient with improvement in symptoms Final Clinical Impressions(s) / UC Diagnoses   Final diagnoses:  Finger infection   Ingrown toenail of right foot     Discharge Instructions  We will go ahead and treat you for infection of the finger. Ibuprofen for pain and inflammation.  Follow up with podiatry for ingrown toenail  Follow up as needed for continued or worsening symptoms      ED Prescriptions    Medication Sig Dispense Auth. Provider   cephALEXin (KEFLEX) 500 MG capsule Take 1 capsule (500 mg total) by mouth 4 (four) times daily for 7 days. 28 capsule Dahlia ByesBast, Briselda Naval A, NP     Controlled Substance Prescriptions [place] Controlled Substance Registry consulted? Not Applicable   Janace ArisBast, Maddyx Wieck A, NP 01/31/19 [phone]

## 2019-04-07 ENCOUNTER — Telehealth: Payer: Self-pay | Admitting: Family Medicine

## 2019-04-07 NOTE — Telephone Encounter (Signed)
Called and could not leave vm for patient due to no DPR. Was calling no see if patient would like to schedule follow up appointment with Doreene Burke.

## 2021-06-18 ENCOUNTER — Ambulatory Visit (HOSPITAL_COMMUNITY)
Admission: EM | Admit: 2021-06-18 | Discharge: 2021-06-18 | Disposition: A | Discharge status: Home / Self Care | Payer: 59 | Attending: Physician Assistant | Admitting: Physician Assistant

## 2021-06-18 ENCOUNTER — Ambulatory Visit (INDEPENDENT_AMBULATORY_CARE_PROVIDER_SITE_OTHER): Payer: 59

## 2021-06-18 ENCOUNTER — Encounter (HOSPITAL_COMMUNITY): Payer: Self-pay

## 2021-06-18 DIAGNOSIS — R300 Dysuria: Secondary | ICD-10-CM

## 2021-06-18 DIAGNOSIS — R319 Hematuria, unspecified: Secondary | ICD-10-CM | POA: Diagnosis not present

## 2021-06-18 DIAGNOSIS — N489 Disorder of penis, unspecified: Secondary | ICD-10-CM | POA: Diagnosis not present

## 2021-06-18 DIAGNOSIS — R8290 Unspecified abnormal findings in urine: Secondary | ICD-10-CM

## 2021-06-18 LAB — POCT URINALYSIS DIPSTICK, ED / UC
Bilirubin Urine: NEGATIVE
Glucose, UA: NEGATIVE mg/dL
Ketones, ur: NEGATIVE mg/dL
Leukocytes,Ua: NEGATIVE
Nitrite: NEGATIVE
Protein, ur: NEGATIVE mg/dL
Specific Gravity, Urine: 1.025 (ref 1.005–1.030)
Urobilinogen, UA: 0.2 mg/dL (ref 0.0–1.0)
pH: 5 (ref 5.0–8.0)

## 2021-06-18 LAB — HEPATITIS PANEL, ACUTE
HCV Ab: NONREACTIVE
Hep A IgM: NONREACTIVE
Hep B C IgM: NONREACTIVE
Hepatitis B Surface Ag: NONREACTIVE

## 2021-06-18 LAB — HIV ANTIBODY (ROUTINE TESTING W REFLEX): HIV Screen 4th Generation wRfx: NONREACTIVE

## 2021-06-18 LAB — RPR: RPR Ser Ql: NONREACTIVE

## 2021-06-18 NOTE — ED Provider Notes (Signed)
[facility]    CSN: [id number] Arrival date & time: 06/18/21  1008      History   Chief Complaint Chief Complaint  Patient presents with   Dysuria    HPI Antonio Snow is a 60 y.o. male.   Patient presents today with a several week history of penile lesions and dysuria.  He reports associated urinary urgency and frequency.  He denies any penile discharge, scrotal swelling, nausea, vomiting, abdominal pain, back pain, fever.  He has been applying hydrocortisone cream and cleaning lesions with warm water and soap which has improved but not resolved them.  He denies any history of similar symptoms in the past.  He is sexually active and has no specific concern for STI but is open to testing today.  Denies history of recurrent UTI or nephrolithiasis.  Denies any recent antibiotic use.  Denies any changes to personal hygiene products including soaps or detergents.   History reviewed. No pertinent past medical history.  Patient Active Problem List   Diagnosis Date Noted   Tinea manuum, pedis, and unguium 01/26/2018   Tinea cruris 01/26/2018   Chronic pain of right knee 01/26/2018   Hematochezia 01/26/2018   BPH associated with nocturia 01/26/2018   Elevated blood pressure 07/07/2013   GERD (gastroesophageal reflux disease) 07/07/2013   Encounter for health maintenance examination with abnormal findings 07/07/2013    History reviewed. No pertinent surgical history.     Home Medications    Prior to Admission medications   Medication Sig Start Date End Date Taking? Authorizing Provider  ketoconazole (NIZORAL) 2 % cream Apply to rash on groin daily for 2 weeks. 01/26/18   Mliss Sax, MD  meloxicam (MOBIC) 15 MG tablet Take 1 tablet (15 mg total) by mouth daily. 04/28/18   Cathie Hoops, Amy V, PA-C  terbinafine (LAMISIL) 250 MG tablet Take 1 tablet (250 mg total) by mouth daily. 01/26/18   Mliss Sax, MD    Family History Family History  Problem Relation  Age of Onset   Cancer Father     Social History Social History   Tobacco Use   Smoking status: Light Smoker    Packs/day: 0.25    Pack years: 0.00    Types: Cigarettes   Smokeless tobacco: Never  Vaping Use   Vaping Use: Never used  Substance Use Topics   Alcohol use: No   Drug use: No     Allergies   Patient has no known allergies.   Review of Systems Review of Systems  Constitutional:  Negative for activity change, appetite change, fatigue and fever. Respiratory:  Negative for cough and shortness of breath.   Cardiovascular:  Negative for chest pain. Gastrointestinal:  Negative for abdominal pain, diarrhea, nausea and vomiting. Genitourinary:  Positive for dysuria, frequency, genital sores and urgency. Negative for penile discharge, penile pain, penile swelling and scrotal swelling. Neurological:  Negative for dizziness, light-headedness and headaches.   Physical Exam Triage Vital Signs ED Triage Vitals  Enc Vitals Group     BP 06/18/21 1124 127/89     Pulse Rate 06/18/21 1124 75     Resp 06/18/21 1124 18     Temp 06/18/21 1124 98.9 F (37.2 C)     Temp Source 06/18/21 1124 Oral     SpO2 06/18/21 1124 100 %     Weight --      Height --      Head Circumference --      Peak Flow --  Pain Score 06/18/21 1122 0     Pain Loc --      Pain Edu? --      Excl. in GC? --    No data found.  Updated Vital Signs BP 127/89 (BP Location: Right Arm)   Pulse 75   Temp 98.9 F (37.2 C) (Oral)   Resp 18   SpO2 100%   Visual Acuity Right Eye Distance:   Left Eye Distance:   Bilateral Distance:    Right Eye Near:   Left Eye Near:    Bilateral Near:     Physical Exam   Vitals reviewed. Exam conducted with a chaperone present. Constitutional:      General: He is awake.    Appearance: Normal appearance. He is normal weight. He is not ill-appearing.    Comments: Very pleasant male appears stated age in no acute distress  HENT:    Head: Normocephalic and  atraumatic. Cardiovascular:    Rate and Rhythm: Normal rate and regular rhythm.    Heart sounds: Normal heart sounds, S1 normal and S2 normal. No murmur heard. Pulmonary:    Effort: Pulmonary effort is normal.    Breath sounds: Normal breath sounds. No stridor. No wheezing, rhonchi or rales.    Comments: Clear to auscultation bilaterally Abdominal:    General: Bowel sounds are normal.    Palpations: Abdomen is soft.    Tenderness: There is no abdominal tenderness. There is no right CVA tenderness, left CVA tenderness, guarding or rebound.    Comments: Benign abdominal exam  Genitourinary:    Penis: Circumcised. Lesions present.      Testes:        Right: Mass or tenderness not present.        Left: Mass or tenderness not present.    Comments: Kim, RN present as chaperone during exam.  Several ulcerated lesions noted inferior penis without bleeding or drainage.  No discharge noted. Neurological:    Mental Status: He is alert. Psychiatric:        Behavior: Behavior is cooperative.   UC Treatments / Results  Labs (all labs ordered are listed, but only abnormal results are displayed) Labs Reviewed  POCT URINALYSIS DIPSTICK, ED / UC - Abnormal; Notable for the following components:      Result Value   Hgb urine dipstick MODERATE (*)    All other components within normal limits  URINE CULTURE  HIV ANTIBODY (ROUTINE TESTING W REFLEX)  RPR  HEPATITIS PANEL, ACUTE  CYTOLOGY, (ORAL, ANAL, URETHRAL) ANCILLARY ONLY    EKG   Radiology DG Abdomen 1 View  Result Date: 06/18/2021 CLINICAL DATA:  Hematuria and dysuria for 2 weeks EXAM: ABDOMEN - 1 VIEW COMPARISON:  None. FINDINGS: Scattered large and small bowel gas is noted. No obstructive changes are seen. No free air is noted. No acute bony abnormality is seen. No abnormal calcifications are seen. IMPRESSION: No acute abnormality noted. Electronically Signed   By: Alcide Clever M.D.   On: 06/18/2021 12:07    Procedures Procedures  (including critical care time)  Medications Ordered in UC Medications - No data to display  Initial Impression / Assessment and Plan / UC Course  I have reviewed the triage vital signs and the nursing notes.  Pertinent labs & imaging results that were available during my care of the patient were reviewed by me and considered in my medical decision making (see chart for details).      Discussed that testing for herpes would be  of limited value given lesions are healing and have been present for several weeks.  UA did show trace hematuria so KUB was obtained which showed no obvious calculus.  We will perform complete STI testing including RPR.  No indication to start antibiotics based on presentation today.  Urine culture was obtained and if this is positive we will call in antibiotics based on lab results.  Patient was encouraged to drink plenty of fluid.  He was given contact information for urologist in case symptoms persist.  Discussed alarm symptoms or warrant emergent evaluation.  Strict return precautions given to which patient expressed understanding.  Final Clinical Impressions(s) / UC Diagnoses   Final diagnoses:  Dysuria  Abnormal urinalysis  Penile lesion     Discharge Instructions      Your urine showed a little bit of blood but your x-ray of your abdomen was normal.  We will send your urine off for culture and if this grows any bacteria contact you to start antibiotics.  We will contact you if we have any abnormalities on your STI testing.  I have given you the contact information for urologist and like you to call them if symptoms persist.  If you have any worsening symptoms please return for reevaluation.     ED Prescriptions   None    PDMP not reviewed this encounter.   Jeani Hawking, PA-C 06/18/21 1241

## 2021-06-18 NOTE — Discharge Instructions (Addendum)
Your urine showed a little bit of blood but your x-ray of your abdomen was normal.  We will send your urine off for culture and if this grows any bacteria contact you to start antibiotics.  We will contact you if we have any abnormalities on your STI testing.  I have given you the contact information for urologist and like you to call them if symptoms persist.  If you have any worsening symptoms please return for reevaluation.

## 2021-06-18 NOTE — ED Triage Notes (Signed)
Pt reports burning when urinating x 2 weeks; brown and white spots in the penis, tingling  in the penis  x 2 weeks.

## 2021-06-19 LAB — CYTOLOGY, (ORAL, ANAL, URETHRAL) ANCILLARY ONLY
Chlamydia: NEGATIVE
Comment: NEGATIVE
Comment: NEGATIVE
Comment: NORMAL
Neisseria Gonorrhea: NEGATIVE
Trichomonas: NEGATIVE

## 2021-06-19 LAB — URINE CULTURE: Culture: NO GROWTH

## 2021-10-14 ENCOUNTER — Other Ambulatory Visit: Payer: Self-pay

## 2021-10-14 ENCOUNTER — Ambulatory Visit (INDEPENDENT_AMBULATORY_CARE_PROVIDER_SITE_OTHER): Payer: Self-pay

## 2021-10-14 ENCOUNTER — Ambulatory Visit (HOSPITAL_COMMUNITY)
Admission: EM | Admit: 2021-10-14 | Discharge: 2021-10-14 | Disposition: A | Discharge status: Home / Self Care | Payer: Self-pay | Attending: Student | Admitting: Student

## 2021-10-14 ENCOUNTER — Encounter (HOSPITAL_COMMUNITY): Payer: Self-pay | Admitting: Emergency Medicine

## 2021-10-14 DIAGNOSIS — M25562 Pain in left knee: Secondary | ICD-10-CM

## 2021-10-14 DIAGNOSIS — S86912A Strain of unspecified muscle(s) and tendon(s) at lower leg level, left leg, initial encounter: Secondary | ICD-10-CM

## 2021-10-14 MED ORDER — PREDNISONE 20 MG PO TABS: 40.0000 mg | ORAL_TABLET | Freq: Every day | ORAL | 0 refills | Status: AC

## 2021-10-14 MED ORDER — TIZANIDINE HCL 2 MG PO TABS: 2.0000 mg | ORAL_TABLET | Freq: Three times a day (TID) | ORAL | 0 refills | Status: DC | PRN

## 2021-10-14 NOTE — Discharge Instructions (Addendum)
-  Knee brace while standing and walking -Prednisone, 2 pills taken at the same time for 5 days in a row.  Try taking this earlier in the day as it can give you energy. Avoid NSAIDs like ibuprofen and alleve while taking this medication as they can increase your risk of stomach upset and even GI bleeding when in combination with a steroid. You can continue tylenol (acetaminophen) up to 1000mg  3x daily. -After you finish the prednisone you can resume the ibuprofen 800mg  -If symptoms persist in 7 days, follow-up with an orthopedist. I recommend [facility] at [address] [place], [address] [place]. You can schedule an appointment by calling [phone]) or online ([phone]), but they also have a walk-in clinic M-F 8a-8p and Sat 10a-3p.

## 2021-10-14 NOTE — ED Provider Notes (Signed)
[facility]    CSN: [id number] Arrival date & time: 10/14/21  1009      History   Chief Complaint Chief Complaint  Patient presents with   Knee Pain    HPI Antonio Snow is a 60 y.o. male presenting with L knee pain x3 weeks following new job. Medical history chronic R knee pain. States he started a new job involving standing and walking all day. L knee is stiff and painful, particularly the back of the knee. States it looks a little swollen. Ibuprofen 800mg  providing minimal relief.   HPI  History reviewed. No pertinent past medical history.  Patient Active Problem List   Diagnosis Date Noted   Tinea manuum, pedis, and unguium 01/26/2018   Tinea cruris 01/26/2018   Chronic pain of right knee 01/26/2018   Hematochezia 01/26/2018   BPH associated with nocturia 01/26/2018   Elevated blood pressure 07/07/2013   GERD (gastroesophageal reflux disease) 07/07/2013   Encounter for health maintenance examination with abnormal findings 07/07/2013    History reviewed. No pertinent surgical history.     Home Medications    Prior to Admission medications   Medication Sig Start Date End Date Taking? Authorizing Provider  ibuprofen (ADVIL) 800 MG tablet Take 800 mg by mouth every 8 (eight) hours as needed.   Yes [provider]  predniSONE (DELTASONE) 20 MG tablet Take 2 tablets (40 mg total) by mouth daily for 5 days. Take with breakfast or lunch. Avoid NSAIDs (ibuprofen, etc) while taking this medication. 10/14/21 10/19/21 Yes 10/21/21, PA-C  Pseudoephedrine-Acetaminophen (ALKA-SELTZER PLUS COLD/SINUS PO) Take by mouth.   Yes [provider]  tiZANidine (ZANAFLEX) 2 MG tablet Take 1 tablet (2 mg total) by mouth every 8 (eight) hours as needed for muscle spasms. 10/14/21  Yes 10/16/21, PA-C    Family History Family History  Problem Relation Age of Onset   Cancer Mother    Cancer Father     Social History Social History    Tobacco Use   Smoking status: Light Smoker    Packs/day: 0.25    Types: Cigarettes   Smokeless tobacco: Never  Vaping Use   Vaping Use: Former  Substance Use Topics   Alcohol use: No   Drug use: No     Allergies   Patient has no known allergies.   Review of Systems Review of Systems  Musculoskeletal:  Positive for joint swelling.  All other systems reviewed and are negative.   Physical Exam Triage Vital Signs ED Triage Vitals  Enc Vitals Group     BP 10/14/21 1044 (!) 153/98     Pulse Rate 10/14/21 1044 74     Resp 10/14/21 1044 20     Temp 10/14/21 1044 98.4 F (36.9 C)     Temp Source 10/14/21 1044 Oral     SpO2 10/14/21 1044 98 %     Weight --      Height --      Head Circumference --      Peak Flow --      Pain Score 10/14/21 1039 8     Pain Loc --      Pain Edu? --      Excl. in GC? --    No data found.  Updated Vital Signs BP (!) 153/98 (BP Location: Right Arm)   Pulse 74   Temp 98.4 F (36.9 C) (Oral)   Resp 20   SpO2 98%   Visual Acuity  Right Eye Distance:   Left Eye Distance:   Bilateral Distance:    Right Eye Near:   Left Eye Near:    Bilateral Near:     Physical Exam Vitals reviewed.  Constitutional:      Appearance: Normal appearance.  HENT:     Head: Normocephalic and atraumatic.  Pulmonary:     Effort: Pulmonary effort is normal.  Musculoskeletal:     Comments: L knee- mild effusion to anterior aspect. No point tenderness or bony abnormality. ROM flexion and extension intact but some posterior knee pain with extension. Ambulating with pain. No joint laxity or crepitus. Negative mcmurray, valgus/varus stress test. Calves are equal and symmetric. DP2+.  Neurological:     General: No focal deficit present.     Mental Status: He is alert and oriented to person, place, and time.  Psychiatric:        Mood and Affect: Mood normal.        Behavior: Behavior normal.        Thought Content: Thought content normal.        Judgment:  Judgment normal.     UC Treatments / Results  Labs (all labs ordered are listed, but only abnormal results are displayed) Labs Reviewed - No data to display  EKG   Radiology DG Knee 2 Views Left  Result Date: 10/14/2021 CLINICAL DATA:  29-year-old male with knee pain and swelling. EXAM: LEFT KNEE - 1-2 VIEW COMPARISON:  None. FINDINGS: No evidence of fracture, dislocation, or joint effusion. No evidence of arthropathy or other focal bone abnormality. Soft tissues are unremarkable. IMPRESSION: No acute fracture, malalignment, or significant arthropathy. Electronically Signed   By: Marliss Coots M.D.   On: 10/14/2021 10:58    Procedures Procedures (including critical care time)  Medications Ordered in UC Medications - No data to display  Initial Impression / Assessment and Plan / UC Course  I have reviewed the triage vital signs and the nursing notes.  Pertinent labs & imaging results that were available during my care of the patient were reviewed by me and considered in my medical decision making (see chart for details).     This patient is a very pleasant 60 y.o. year old male presenting with L knee strain following overuse. Neurovascularly intact.   Xray L knee-  No acute fracture, malalignment, or significant arthropathy.  Knee brace, prednisone, zanaflex, f/u with ortho.   ED return precautions discussed. Patient verbalizes understanding and agreement.    Final Clinical Impressions(s) / UC Diagnoses   Final diagnoses:  Knee strain, left, initial encounter     Discharge Instructions      -Knee brace while standing and walking -Prednisone, 2 pills taken at the same time for 5 days in a row.  Try taking this earlier in the day as it can give you energy. Avoid NSAIDs like ibuprofen and alleve while taking this medication as they can increase your risk of stomach upset and even GI bleeding when in combination with a steroid. You can continue tylenol (acetaminophen) up  to 1000mg  3x daily. -After you finish the prednisone you can resume the ibuprofen 800mg  -If symptoms persist in 7 days, follow-up with an orthopedist. I recommend [facility] at [address], [place], [address] [place]. You can schedule an appointment by calling [phone]) or online [phone]), but they also have a walk-in clinic M-F 8a-8p and Sat 10a-3p.    ED Prescriptions     Medication Sig Dispense Auth. Provider   predniSONE (  DELTASONE) 20 MG tablet Take 2 tablets (40 mg total) by mouth daily for 5 days. Take with breakfast or lunch. Avoid NSAIDs (ibuprofen, etc) while taking this medication. 10 tablet Rhys Martini, PA-C   tiZANidine (ZANAFLEX) 2 MG tablet Take 1 tablet (2 mg total) by mouth every 8 (eight) hours as needed for muscle spasms. 21 tablet Rhys Martini, PA-C      PDMP not reviewed this encounter.   Rhys Martini, PA-C 10/14/21 1132

## 2021-10-14 NOTE — ED Triage Notes (Signed)
Left knee pain for 3 weeks, left knee swollen.  Patient has a pulling sensation in back of left knee.  Patient is limping with ambulation.  Prior to knee pain, patient reports cramping feelings in leg intermittently.  Patient has started a new job in the last couple of months.  No known injury

## 2022-05-02 IMAGING — DX DG KNEE 1-2V*L*
2 series · 2 of 2 positions shown · non-contrast
Comparison: None.

CLINICAL DATA: 6-year-old male with knee pain and swelling.

EXAM:
LEFT KNEE - 1-2 VIEW

[knee ap]
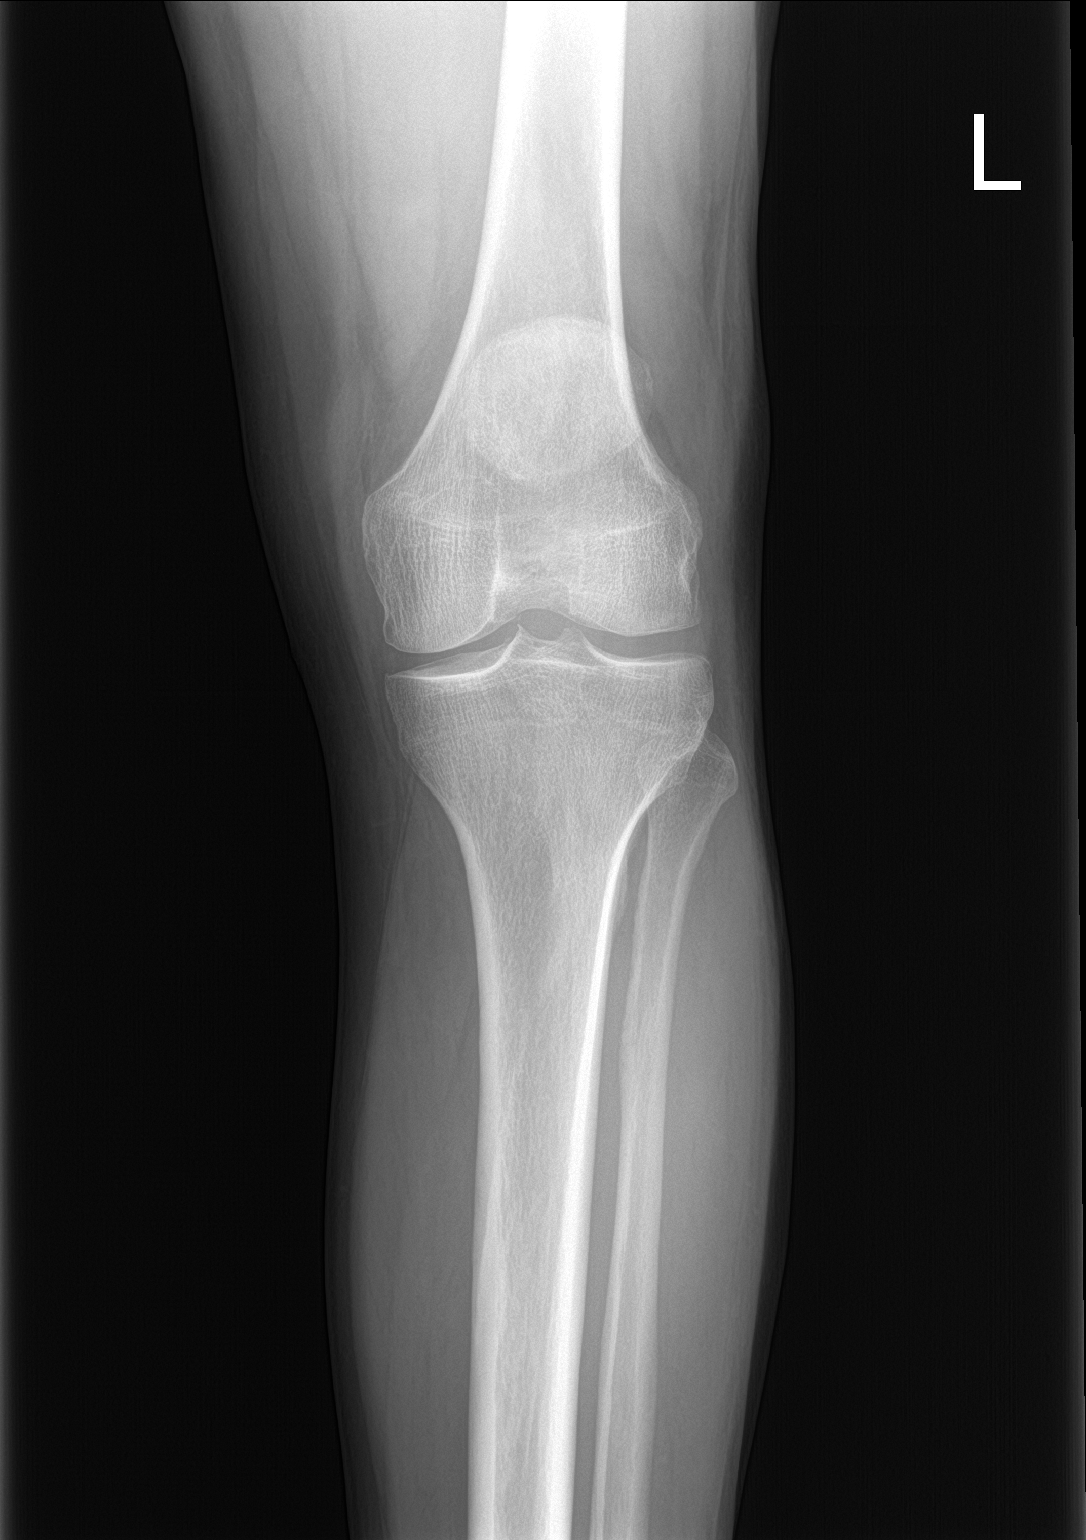

[knee lat]
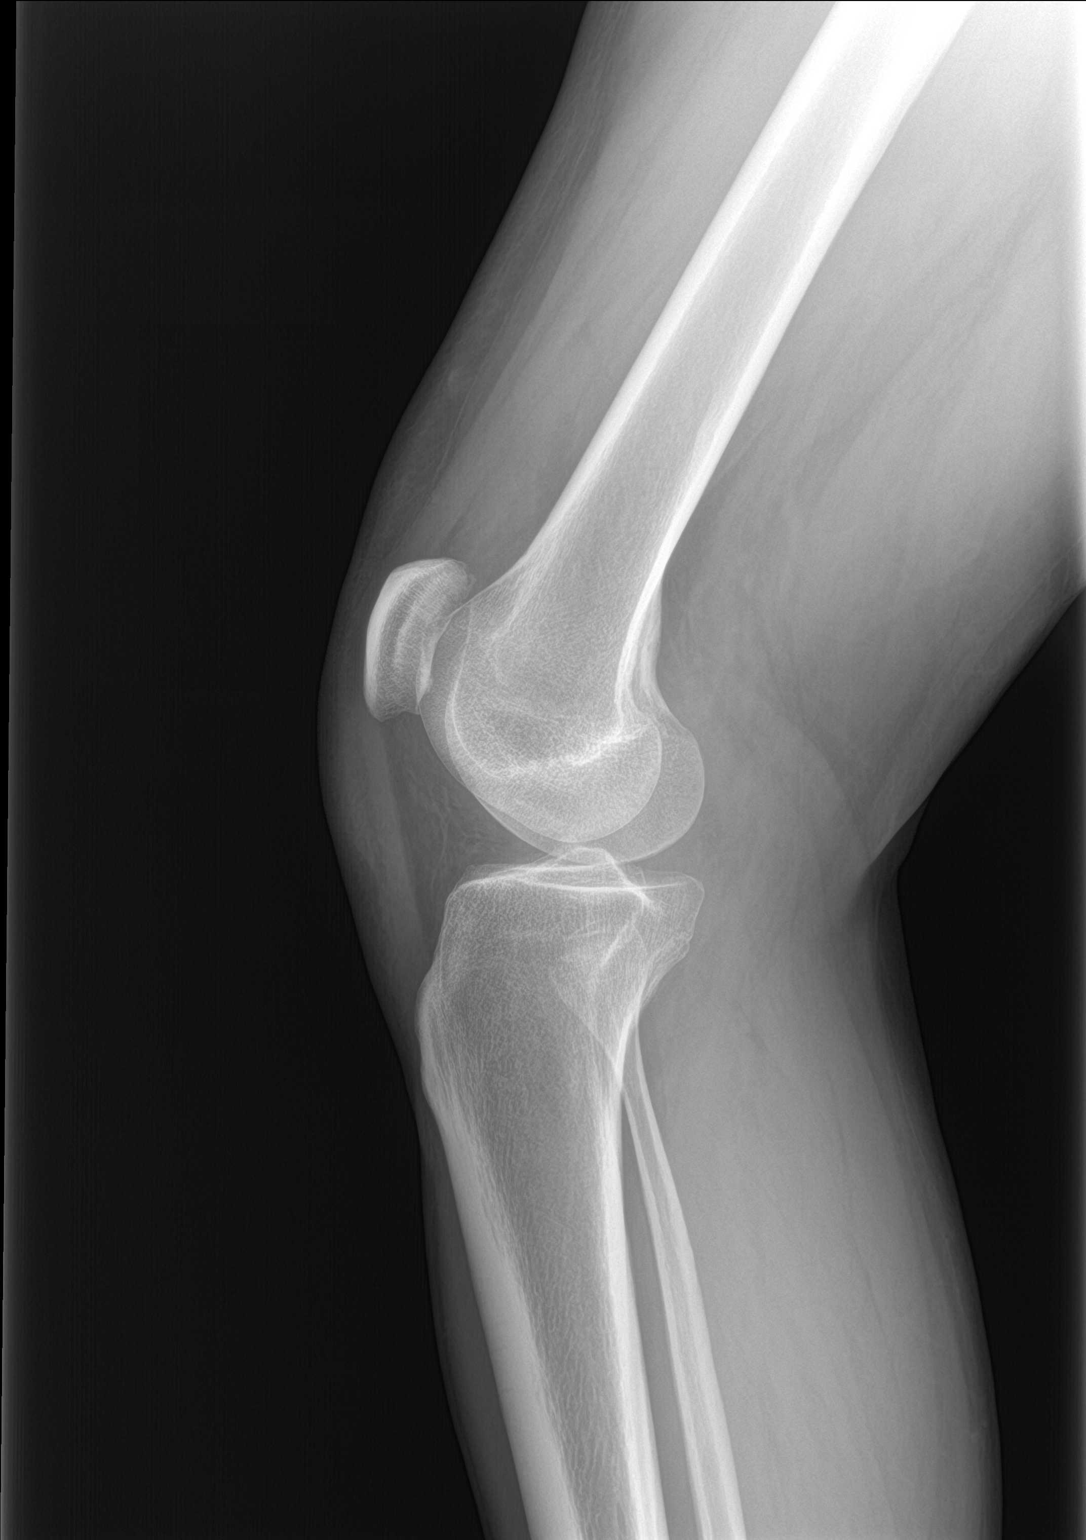

[2 of 2 positions shown; findings below may reference images not displayed]

FINDINGS: No evidence of fracture, dislocation, or joint effusion. No evidence
of arthropathy or other focal bone abnormality. Soft tissues are
unremarkable.
IMPRESSION: No acute fracture, malalignment, or significant arthropathy.

## 2022-06-01 ENCOUNTER — Emergency Department (HOSPITAL_COMMUNITY): Payer: PRIVATE HEALTH INSURANCE

## 2022-06-01 ENCOUNTER — Other Ambulatory Visit: Payer: Self-pay

## 2022-06-01 ENCOUNTER — Encounter (HOSPITAL_COMMUNITY): Payer: Self-pay

## 2022-06-01 ENCOUNTER — Emergency Department (HOSPITAL_COMMUNITY)
Admission: EM | Admit: 2022-06-01 | Discharge: 2022-06-01 | Disposition: A | Discharge status: Home / Self Care | Payer: PRIVATE HEALTH INSURANCE | Attending: Emergency Medicine | Admitting: Emergency Medicine

## 2022-06-01 DIAGNOSIS — G43909 Migraine, unspecified, not intractable, without status migrainosus: Secondary | ICD-10-CM | POA: Insufficient documentation

## 2022-06-01 LAB — CBC WITH DIFFERENTIAL/PLATELET
Abs Immature Granulocytes: 0.02 10*3/uL (ref 0.00–0.07)
Basophils Absolute: 0.1 10*3/uL (ref 0.0–0.1)
Basophils Relative: 1 %
Eosinophils Absolute: 0.1 10*3/uL (ref 0.0–0.5)
Eosinophils Relative: 1 %
HCT: 41.1 % (ref 39.0–52.0)
Hemoglobin: 13.2 g/dL (ref 13.0–17.0)
Immature Granulocytes: 0 %
Lymphocytes Relative: 33 %
Lymphs Abs: 2.2 10*3/uL (ref 0.7–4.0)
MCH: 30.2 pg (ref 26.0–34.0)
MCHC: 32.1 g/dL (ref 30.0–36.0)
MCV: 94.1 fL (ref 80.0–100.0)
Monocytes Absolute: 0.5 10*3/uL (ref 0.1–1.0)
Monocytes Relative: 8 %
Neutro Abs: 3.8 10*3/uL (ref 1.7–7.7)
Neutrophils Relative %: 57 %
Platelets: 238 10*3/uL (ref 150–400)
RBC: 4.37 MIL/uL (ref 4.22–5.81)
RDW: 14.1 % (ref 11.5–15.5)
WBC: 6.7 10*3/uL (ref 4.0–10.5)
nRBC: 0 % (ref 0.0–0.2)

## 2022-06-01 LAB — COMPREHENSIVE METABOLIC PANEL
ALT: 26 U/L (ref 0–44)
AST: 21 U/L (ref 15–41)
Albumin: 4.3 g/dL (ref 3.5–5.0)
Alkaline Phosphatase: 61 U/L (ref 38–126)
Anion gap: 7 (ref 5–15)
BUN: 16 mg/dL (ref 8–23)
CO2: 25 mmol/L (ref 22–32)
Calcium: 9.6 mg/dL (ref 8.9–10.3)
Chloride: 111 mmol/L (ref 98–111)
Creatinine, Ser: 1.2 mg/dL (ref 0.61–1.24)
GFR, Estimated: >60 mL/min (ref >60)
Glucose, Bld: 98 mg/dL (ref 70–99)
Potassium: 3.6 mmol/L (ref 3.5–5.1)
Sodium: 143 mmol/L (ref 135–145)
Total Bilirubin: 0.6 mg/dL (ref 0.3–1.2)
Total Protein: 7.4 g/dL (ref 6.5–8.1)

## 2022-06-01 LAB — URINALYSIS, ROUTINE W REFLEX MICROSCOPIC
Bacteria, UA: NONE SEEN
Bilirubin Urine: NEGATIVE
Glucose, UA: NEGATIVE mg/dL
Ketones, ur: NEGATIVE mg/dL
Leukocytes,Ua: NEGATIVE
Nitrite: NEGATIVE
Protein, ur: NEGATIVE mg/dL
Specific Gravity, Urine: 1.025 (ref 1.005–1.030)
pH: 5 (ref 5.0–8.0)

## 2022-06-01 LAB — RAPID URINE DRUG SCREEN, HOSP PERFORMED
Amphetamines: NOT DETECTED
Barbiturates: NOT DETECTED
Benzodiazepines: NOT DETECTED
Cocaine: POSITIVE — AB
Opiates: NOT DETECTED
Tetrahydrocannabinol: NOT DETECTED

## 2022-06-01 MED ORDER — ACETAMINOPHEN 325 MG PO TABS
650.0000 mg | ORAL_TABLET | Freq: Once | ORAL | Status: AC
  Administered 2022-06-01: 650 mg via ORAL
  Filled 2022-06-01: qty 2

## 2022-06-01 NOTE — ED Triage Notes (Addendum)
Pt c/o migraines x1 month off and on. Pt c/o bilateral leg cramps for the last few months. Pt states he has been doing cocaine the last few months, last use was yesterday.

## 2022-06-01 NOTE — ED Provider Notes (Signed)
[facility] DEPT Provider Note   CSN: [id number] Arrival date & time: 06/01/22  1501     History  Chief Complaint  Patient presents with   Migraine   Leg Cramps    Antonio Snow is a 61 y.o. male who presents to the emergency department complaining of  intermittent migraines x1 month.  Notes he has a lot of family stressors recently of his brother passing away.  Notes that he last used cocaine yesterday.  Denies chest pain, shortness of breath, blurred vision, double vision.  His headaches feel similar to his previous headaches.  The history is provided by the patient. No language interpreter was used.       Home Medications Prior to Admission medications   Medication Sig Start Date End Date Taking? Authorizing Provider  ibuprofen (ADVIL) 800 MG tablet Take 800 mg by mouth every 8 (eight) hours as needed.    [provider]  Pseudoephedrine-Acetaminophen (ALKA-SELTZER PLUS COLD/SINUS PO) Take by mouth.    [provider]  tiZANidine (ZANAFLEX) 2 MG tablet Take 1 tablet (2 mg total) by mouth every 8 (eight) hours as needed for muscle spasms. 10/14/21   Hazel Sams, PA-C      Allergies    Patient has no known allergies.    Review of Systems   Review of Systems  Eyes:  Negative for visual disturbance.  Respiratory:  Negative for shortness of breath.   Cardiovascular:  Negative for chest pain.  Neurological:  Positive for headaches.  All other systems reviewed and are negative.   Physical Exam Updated Vital Signs BP (!) 114/102   Pulse 70   Temp 98.4 F (36.9 C) (Oral)   Resp 16   SpO2 100%  Physical Exam Vitals and nursing note reviewed.  Constitutional:      General: He is not in acute distress.    Appearance: He is not diaphoretic.  HENT:     Head: Normocephalic and atraumatic.     Mouth/Throat:     Pharynx: No oropharyngeal exudate.  Eyes:     General: No scleral icterus.    Conjunctiva/sclera:  Conjunctivae normal.  Cardiovascular:     Rate and Rhythm: Normal rate and regular rhythm.     Pulses: Normal pulses.     Heart sounds: Normal heart sounds.  Pulmonary:     Effort: Pulmonary effort is normal. No respiratory distress.     Breath sounds: Normal breath sounds. No wheezing.  Abdominal:     General: Bowel sounds are normal.     Palpations: Abdomen is soft. There is no mass.     Tenderness: There is no abdominal tenderness. There is no guarding or rebound.  Musculoskeletal:        General: Normal range of motion.     Cervical back: Normal range of motion and neck supple.     Comments: Strength and sensation intact to bilateral upper and lower extremities.  Skin:    General: Skin is warm and dry.  Neurological:     General: No focal deficit present.     Mental Status: He is alert.     Cranial Nerves: Cranial nerves 2-12 are intact. No cranial nerve deficit.     Sensory: Sensation is intact.     Motor: No pronator drift.  Psychiatric:        Behavior: Behavior normal.    ED Results / Procedures / Treatments   Labs (all labs ordered are listed, but only abnormal  results are displayed) Labs Reviewed  URINALYSIS, ROUTINE W REFLEX MICROSCOPIC - Abnormal; Notable for the following components:      Result Value   Hgb urine dipstick MODERATE (*)    All other components within normal limits  RAPID URINE DRUG SCREEN, HOSP PERFORMED - Abnormal; Notable for the following components:   Cocaine POSITIVE (*)    All other components within normal limits  URINE CULTURE  COMPREHENSIVE METABOLIC PANEL  CBC WITH DIFFERENTIAL/PLATELET    EKG EKG Interpretation  Date/Time:  Saturday June 01 2022 18:05:36 EDT Ventricular Rate:  66 PR Interval:  137 QRS Duration: 102 QT Interval:  408 QTC Calculation: 428 R Axis:   54 Text Interpretation: Sinus rhythm Ventricular premature complex Probable left atrial enlargement Nonspecific T abnormalities, diffuse leads Confirmed by Kristine Royal [phone]) on 06/01/2022 7:23:28 PM  Radiology CT Head Wo Contrast  Result Date: 06/01/2022 CLINICAL DATA:  Headache, intermittent migraines for 1 month EXAM: CT HEAD WITHOUT CONTRAST TECHNIQUE: Contiguous axial images were obtained from the base of the skull through the vertex without intravenous contrast. RADIATION DOSE REDUCTION: This exam was performed according to the departmental dose-optimization program which includes automated exposure control, adjustment of the mA and/or kV according to patient size and/or use of iterative reconstruction technique. COMPARISON:  None Available. FINDINGS: Brain: No evidence of acute infarction, hemorrhage, hydrocephalus, extra-axial collection or mass lesion/mass effect. Vascular: No hyperdense vessel or unexpected calcification. Skull: Normal. Negative for fracture or focal lesion. Sinuses/Orbits: No acute finding. Other: None. IMPRESSION: No acute intracranial pathology. No non-contrast CT findings to explain headache. Electronically Signed   By: Jearld Lesch M.D.   On: 06/01/2022 17:58    Procedures Procedures    Medications Ordered in ED Medications  acetaminophen (TYLENOL) tablet 650 mg (650 mg Oral Given 06/01/22 1834)    ED Course/ Medical Decision Making/ A&P Clinical Course as of 06/02/22 0009  Sat Jun 01, 2022  1809 Patient reevaluated and noted improvement of his headache.  He notes his headache is currently a 1-2 out of 10 at this time. [SB]    Clinical Course User Index [SB] Karizma Cheek A, PA-C                           Medical Decision Making Amount and/or Complexity of Data Reviewed Labs: ordered. Radiology: ordered.  Risk OTC drugs.   Pt presented to the ED with headache that has been intermittent for the past month.  Recent family stressors of his brother passing. Has a history of headaches and notes that this headache feels similar to his previous headaches.  Notes that he recently used cocaine with his last use being  yesterday.  Denies chest pain, shortness of breath, urinary symptoms.. Vital signs stable, patient afebrile. On exam, pt without focal neurological deficits on exam.  No vision changes.  No red flags of worst headache of life.  Differential diagnosis includes SAH, ICH, migraine, tension headache.  Labs:  I ordered, and personally interpreted labs.  The pertinent results include:   UDS positive for cocaine. Urinalysis notable for moderate amount of hemoglobin otherwise nitrate and leukocyte negative, urine culture sent. CMP and CBC unremarkable  Imaging: I ordered imaging studies including CT head without contrast I independently visualized and interpreted imaging which showed: No acute intracranial abnormality I agree with the radiologist interpretation  Medications:  I ordered medication including tylenol for symptom management Reevaluation of the patient after these medicines and interventions,  I reevaluated the patient and found that they have improved I have reviewed the patients home medicines and have made adjustments as needed   Disposition: Presentation suspicious for migraine. Presentation less likely due to [facility] or ICH due to the absence of red flags.  After consideration of the diagnostic results and the patients response to treatment, I feel that the patient would benefit from Discharge home.  Discussed with patient they may follow-up with their primary care provider as needed.  Supportive care measures and strict return precautions discussed with patient.  Patient knowledges and verbalized understanding.  Patient agreeable to discharge treatment plan. Patient appears safe for discharge at this time.  Follow-up as indicated in the discharge paperwork.   This chart was dictated using voice recognition software, Dragon. Despite the best efforts of this provider to proofread and correct errors, errors may still occur which can change documentation meaning.  Final Clinical  Impression(s) / ED Diagnoses Final diagnoses:  Migraine without status migrainosus, not intractable, unspecified migraine type    Rx / DC Orders ED Discharge Orders     None         Vylet Maffia A, PA-C 06/02/22 0011    Valarie Merino, MD 06/02/22 2311

## 2022-06-01 NOTE — ED Provider Triage Note (Signed)
Emergency Medicine Provider Triage Evaluation Note  ROGERS DITTER , a 61 y.o. male  was evaluated in triage.  Pt complains of intermittent migraines x1 month.  Notes he has a lot of family stressors recently of his brother passing away.  Notes that he last used cocaine yesterday.  Denies chest pain, shortness of breath, blurred vision, double vision.  His headaches feel similar to his previous headaches.  Review of Systems  Positive: As per HPI above Negative:   Physical Exam  BP (!) 142/82 (BP Location: Left Arm)   Pulse 79   Temp 98.4 F (36.9 C) (Oral)   Resp 18   SpO2 99%  Gen:   Awake, no distress   Resp:  Normal effort  MSK:   Moves extremities without difficulty  Other:    Medical Decision Making  Medically screening exam initiated at 4:16 PM.  Appropriate orders placed.  Berton Lan was informed that the remainder of the evaluation will be completed by another provider, this initial triage assessment does not replace that evaluation, and the importance of remaining in the ED until their evaluation is complete.  Work-up initiated   Ilani Otterson A, PA-C 06/01/22 1617

## 2022-06-01 NOTE — Discharge Instructions (Addendum)
It was a pleasure take care of you today!  Your labs and imaging today were overall unremarkable.  You may take over-the-counter 600 mg ibuprofen every 6 hours or 500 mg Tylenol every 6 hours as needed for headache.  Ensure to maintain fluid intake with water, soup, tea, broth, Pedialyte, Gatorade.  Call your primary care provider to set up a follow-up appoint regarding today's ED visit.  Return to the emergency [facility] for experiencing increasing/worsening headache, blurred vision, double vision, worsening symptoms.

## 2022-06-03 LAB — URINE CULTURE: Culture: NO GROWTH

## 2022-06-06 ENCOUNTER — Ambulatory Visit (INDEPENDENT_AMBULATORY_CARE_PROVIDER_SITE_OTHER): Payer: PRIVATE HEALTH INSURANCE | Admitting: Family Medicine

## 2022-06-06 ENCOUNTER — Encounter: Payer: Self-pay | Admitting: Family Medicine

## 2022-06-06 VITALS — BP 110/72 | HR 56 | Temp 97.5°F | Ht 74.0 in | Wt 229.6 lb

## 2022-06-06 DIAGNOSIS — Z720 Tobacco use: Secondary | ICD-10-CM

## 2022-06-06 DIAGNOSIS — R311 Benign essential microscopic hematuria: Secondary | ICD-10-CM

## 2022-06-06 DIAGNOSIS — R0981 Nasal congestion: Secondary | ICD-10-CM | POA: Diagnosis not present

## 2022-06-06 DIAGNOSIS — G43409 Hemiplegic migraine, not intractable, without status migrainosus: Secondary | ICD-10-CM

## 2022-06-06 DIAGNOSIS — F1410 Cocaine abuse, uncomplicated: Secondary | ICD-10-CM

## 2022-06-06 DIAGNOSIS — K921 Melena: Secondary | ICD-10-CM

## 2022-06-06 DIAGNOSIS — H6982 Other specified disorders of Eustachian tube, left ear: Secondary | ICD-10-CM | POA: Diagnosis not present

## 2022-06-06 MED ORDER — PREDNISONE 20 MG PO TABS: 20.0000 mg | ORAL_TABLET | Freq: Two times a day (BID) | ORAL | 0 refills | Status: AC

## 2022-06-06 MED ORDER — SUMATRIPTAN SUCCINATE 50 MG PO TABS: 50.0000 mg | ORAL_TABLET | ORAL | 0 refills | Status: DC | PRN

## 2022-06-06 NOTE — Progress Notes (Signed)
Established Patient Office Visit  Subjective   Patient ID: HAI GRABE, male    DOB: 1961-07-08  Age: 61 y.o. MRN: [id number]  Chief Complaint  Patient presents with   Hospitalization Follow-up    Hospital follow up seen for migraines, per patient still present with no improvement.     HPI for follow-up of headache status post emergency rooms visit 5 days ago.  Headaches of gradually been increasing over the last year.  He has a history of migraine headaches.  These are his usual migraines.  There are no prodromal symptoms.  They are usually on the left side in the pounds.  There is no nausea or vomiting.  There is some light sensitivity.  He says that exercise helps to relieve them.  He has been taking Tylenol and Advil.  His younger brother with Down syndrome recently passed from pancreatic cancer and this is led to a lot of emotional upheaval for him.  He has a new job in a [employer] and finds this to be very stressful.  He has seen no blood in his urine.  He smokes 2 cigars daily.  But does see blood in his stool from time to time.  He is overdue for colonoscopy.  He has ongoing nasal congestion with clear to white rhinorrhea.  There is some ear congestion as well.  Denies facial pressure or teeth pain sneezing itchy watery eyes.  He is using cocaine approximately every other day.  He is accompanied by his wife today.    Review of Systems  Constitutional: Negative.   HENT:  Positive for congestion and hearing loss. Negative for ear discharge, ear pain, sinus pain and sore throat.   Eyes:  Negative for blurred vision, discharge and redness.  Respiratory: Negative.    Cardiovascular: Negative.   Gastrointestinal:  Negative for abdominal pain.  Genitourinary: Negative.   Musculoskeletal: Negative.  Negative for myalgias.  Skin:  Negative for rash.  Neurological:  Negative for tingling, loss of consciousness and weakness.  Endo/Heme/Allergies:  Negative for polydipsia.       06/06/2022   11:36 AM 07/07/2013    3:14 PM  Depression screen PHQ 2/9  Decreased Interest 0 0  Down, Depressed, Hopeless 0 0  PHQ - 2 Score 0 0       Objective:     BP 110/72 (BP Location: Right Arm, Patient Position: Sitting, Cuff Size: Large)   Pulse (!) 56   Temp (!) 97.5 F (36.4 C) (Temporal)   Ht 6\' 2"  (1.88 m)   Wt 229 lb 9.6 oz (104.1 kg)   SpO2 97%   BMI 29.48 kg/m    Physical Exam Constitutional:      General: He is not in acute distress.    Appearance: Normal appearance. He is not ill-appearing, toxic-appearing or diaphoretic.  HENT:     Head: Normocephalic and atraumatic.     Right Ear: Tympanic membrane and external ear normal. No middle ear effusion. Tympanic membrane is not erythematous or retracted.     Left Ear: External ear normal.  No middle ear effusion. Tympanic membrane is retracted. Tympanic membrane is not erythematous.     Mouth/Throat:     Mouth: Mucous membranes are moist.     Pharynx: Oropharynx is clear. No oropharyngeal exudate or posterior oropharyngeal erythema.  Eyes:     General: No scleral icterus.       Right eye: No discharge.        Left eye:  No discharge.     Extraocular Movements: Extraocular movements intact.     Conjunctiva/sclera: Conjunctivae normal.     Pupils: Pupils are equal, round, and reactive to light.  Cardiovascular:     Rate and Rhythm: Normal rate and regular rhythm.  Pulmonary:     Effort: Pulmonary effort is normal. No respiratory distress.     Breath sounds: Normal breath sounds.  Abdominal:     General: Bowel sounds are normal.     Tenderness: There is no abdominal tenderness. There is no guarding.  Musculoskeletal:     Cervical back: No rigidity or tenderness.  Skin:    General: Skin is warm and dry.  Neurological:     Mental Status: He is alert and oriented to person, place, and time.  Psychiatric:        Mood and Affect: Mood normal.        Behavior: Behavior normal.      No results found for  any visits on 06/06/22.    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  Believe that there are multiple factors affecting his headaches.  Cocaine could certainly be 1 and I asked him to stop.  He said that he will try.  Stress could be another.  Question rebound headaches.  Suspect cocaine induced rhinitis.  There is eustachian tube dysfunction.  7-day course of prednisone.  Given Imitrex tablets to take as needed for migraines.  Drowsy precautions given.  Ongoing hematuria.  Urology referral.  Intermittent hematochezia and is due for colonoscopy. Mliss Sax, MD

## 2022-06-20 ENCOUNTER — Encounter: Payer: Self-pay | Admitting: Family Medicine

## 2022-06-20 ENCOUNTER — Ambulatory Visit (INDEPENDENT_AMBULATORY_CARE_PROVIDER_SITE_OTHER): Payer: PRIVATE HEALTH INSURANCE | Admitting: Family Medicine

## 2022-06-20 DIAGNOSIS — G43409 Hemiplegic migraine, not intractable, without status migrainosus: Secondary | ICD-10-CM

## 2022-06-20 MED ORDER — SUMATRIPTAN SUCCINATE 50 MG PO TABS: 50.0000 mg | ORAL_TABLET | ORAL | 1 refills | Status: DC | PRN

## 2022-06-20 NOTE — Progress Notes (Signed)
Established Patient Office Visit  Subjective   Patient ID: Antonio Snow, male    DOB: 1961/11/24  Age: 60 y.o. MRN: [id number]  Chief Complaint  Patient presents with   Follow-up    2 week follow up, no concerns per patient much improvement.     HPI for follow-up of headache and eustachian tube dysfunction.  Prednisone seem to help some.  Headaches have diminished.  He is no longer using.  Imitrex has helped and he would like a refill.  Developed pain in one of his left upper premolars.  Started antibiotics and these of helped as well needs dental work but will not have insurance until August.  This is his 53 year old mother in [place].  She is doing well.  They attended a jazz festival in [place].    Review of Systems  Constitutional: Negative.   HENT: Negative.    Eyes:  Negative for blurred vision, discharge and redness.  Respiratory: Negative.    Cardiovascular: Negative.   Gastrointestinal:  Negative for abdominal pain.  Genitourinary: Negative.   Musculoskeletal: Negative.  Negative for myalgias.  Skin:  Negative for rash.  Neurological:  Negative for tingling, loss of consciousness and weakness.  Endo/Heme/Allergies:  Negative for polydipsia.      Objective:     BP 128/78 (BP Location: Right Arm, Patient Position: Sitting, Cuff Size: Large)   Pulse 78   Temp 98 F (36.7 C) (Temporal)   Ht 6\' 2"  (1.88 m)   Wt 226 lb 12.8 oz (102.9 kg)   SpO2 98%   BMI 29.12 kg/m    Physical Exam Constitutional:      General: He is not in acute distress.    Appearance: Normal appearance. He is not ill-appearing, toxic-appearing or diaphoretic.  HENT:     Head: Normocephalic and atraumatic.     Right Ear: Tympanic membrane, ear canal and external ear normal.     Left Ear: Tympanic membrane, ear canal and external ear normal.     Mouth/Throat:     Mouth: Mucous membranes are moist.     Dentition: Dental caries present.     Pharynx: Oropharynx is clear. No  oropharyngeal exudate or posterior oropharyngeal erythema.  Eyes:     General: No scleral icterus.       Right eye: No discharge.        Left eye: No discharge.     Extraocular Movements: Extraocular movements intact.     Conjunctiva/sclera: Conjunctivae normal.  Cardiovascular:     Rate and Rhythm: Normal rate and regular rhythm.  Pulmonary:     Effort: Pulmonary effort is normal. No respiratory distress.     Breath sounds: Normal breath sounds.  Musculoskeletal:     Cervical back: No rigidity or tenderness.  Skin:    General: Skin is warm and dry.  Neurological:     Mental Status: He is alert and oriented to person, place, and time.  Psychiatric:        Mood and Affect: Mood normal.        Behavior: Behavior normal.      No results found for any visits on 06/20/22.    The ASCVD Risk score (Arnett DK, et al., 2019) failed to calculate for the following reasons:   Cannot find a previous HDL lab   Cannot find a previous total cholesterol lab    Assessment & Plan:   Problem List Items Addressed This Visit       Cardiovascular and Mediastinum  Hemiplegic migraine without status migrainosus, not intractable   Relevant Medications   SUMAtriptan (IMITREX) 50 MG tablet    Return in about 3 months (around 09/20/2022), or Return fasting for CPE..  Waiting to hear about consultations for colonoscopy and hematuria.  We will continue Imitrex as needed  Mliss Sax, MD

## 2022-09-19 ENCOUNTER — Telehealth: Payer: Self-pay | Admitting: Family Medicine

## 2022-09-19 ENCOUNTER — Encounter: Payer: PRIVATE HEALTH INSURANCE | Admitting: Family Medicine

## 2022-09-19 NOTE — Telephone Encounter (Signed)
Pt was a no show for a cpe with Dr. Ethelene Hal on 09/19/22, I sent a letter.

## 2022-09-20 NOTE — Telephone Encounter (Signed)
1st no show for cpe, fee waived, letter sent 

## 2022-10-21 ENCOUNTER — Emergency Department (HOSPITAL_COMMUNITY)
Admission: EM | Admit: 2022-10-21 | Discharge: 2022-10-21 | Disposition: A | Discharge status: Home / Self Care | Payer: PRIVATE HEALTH INSURANCE | Attending: Emergency Medicine | Admitting: Emergency Medicine

## 2022-10-21 ENCOUNTER — Emergency Department (HOSPITAL_COMMUNITY): Payer: PRIVATE HEALTH INSURANCE

## 2022-10-21 ENCOUNTER — Emergency Department (EMERGENCY_DEPARTMENT_HOSPITAL): Payer: PRIVATE HEALTH INSURANCE

## 2022-10-21 ENCOUNTER — Other Ambulatory Visit: Payer: Self-pay

## 2022-10-21 ENCOUNTER — Encounter (HOSPITAL_COMMUNITY): Payer: Self-pay

## 2022-10-21 DIAGNOSIS — E049 Nontoxic goiter, unspecified: Secondary | ICD-10-CM | POA: Diagnosis not present

## 2022-10-21 DIAGNOSIS — J029 Acute pharyngitis, unspecified: Secondary | ICD-10-CM | POA: Insufficient documentation

## 2022-10-21 DIAGNOSIS — M79604 Pain in right leg: Secondary | ICD-10-CM | POA: Diagnosis not present

## 2022-10-21 DIAGNOSIS — K047 Periapical abscess without sinus: Secondary | ICD-10-CM

## 2022-10-21 DIAGNOSIS — M791 Myalgia, unspecified site: Secondary | ICD-10-CM | POA: Insufficient documentation

## 2022-10-21 DIAGNOSIS — K0889 Other specified disorders of teeth and supporting structures: Secondary | ICD-10-CM | POA: Diagnosis present

## 2022-10-21 DIAGNOSIS — K122 Cellulitis and abscess of mouth: Secondary | ICD-10-CM | POA: Diagnosis not present

## 2022-10-21 LAB — BASIC METABOLIC PANEL
Anion gap: 7 (ref 5–15)
BUN: 13 mg/dL (ref 8–23)
CO2: 24 mmol/L (ref 22–32)
Calcium: 9.2 mg/dL (ref 8.9–10.3)
Chloride: 109 mmol/L (ref 98–111)
Creatinine, Ser: 1.11 mg/dL (ref 0.61–1.24)
GFR, Estimated: >60 mL/min (ref >60)
Glucose, Bld: 101 mg/dL — ABNORMAL HIGH (ref 70–99)
Potassium: 3.4 mmol/L — ABNORMAL LOW (ref 3.5–5.1)
Sodium: 140 mmol/L (ref 135–145)

## 2022-10-21 LAB — CBC WITH DIFFERENTIAL/PLATELET
Abs Immature Granulocytes: 0.02 10*3/uL (ref 0.00–0.07)
Basophils Absolute: 0.1 10*3/uL (ref 0.0–0.1)
Basophils Relative: 1 %
Eosinophils Absolute: 0.1 10*3/uL (ref 0.0–0.5)
Eosinophils Relative: 1 %
HCT: 41.8 % (ref 39.0–52.0)
Hemoglobin: 13.2 g/dL (ref 13.0–17.0)
Immature Granulocytes: 0 %
Lymphocytes Relative: 18 %
Lymphs Abs: 1.7 10*3/uL (ref 0.7–4.0)
MCH: 29.7 pg (ref 26.0–34.0)
MCHC: 31.6 g/dL (ref 30.0–36.0)
MCV: 94.1 fL (ref 80.0–100.0)
Monocytes Absolute: 0.8 10*3/uL (ref 0.1–1.0)
Monocytes Relative: 8 %
Neutro Abs: 6.5 10*3/uL (ref 1.7–7.7)
Neutrophils Relative %: 72 %
Platelets: 244 10*3/uL (ref 150–400)
RBC: 4.44 MIL/uL (ref 4.22–5.81)
RDW: 14 % (ref 11.5–15.5)
WBC: 9.2 10*3/uL (ref 4.0–10.5)
nRBC: 0 % (ref 0.0–0.2)

## 2022-10-21 MED ORDER — CLINDAMYCIN HCL 150 MG PO CAPS: 300.0000 mg | ORAL_CAPSULE | Freq: Four times a day (QID) | ORAL | 0 refills | Status: AC

## 2022-10-21 MED ORDER — OXYCODONE-ACETAMINOPHEN 5-325 MG PO TABS
1.0000 | ORAL_TABLET | Freq: Once | ORAL | Status: AC
  Administered 2022-10-21: 1 via ORAL
  Filled 2022-10-21: qty 1

## 2022-10-21 MED ORDER — SODIUM CHLORIDE (PF) 0.9 % IJ SOLN
INTRAMUSCULAR | Status: AC
  Filled 2022-10-21: qty 50

## 2022-10-21 MED ORDER — OXYCODONE HCL 5 MG PO TABS: 5.0000 mg | ORAL_TABLET | ORAL | 0 refills | Status: DC | PRN

## 2022-10-21 MED ORDER — IOHEXOL 300 MG/ML  SOLN
75.0000 mL | Freq: Once | INTRAMUSCULAR | Status: AC | PRN
  Administered 2022-10-21: 75 mL via INTRAVENOUS

## 2022-10-21 MED ORDER — SODIUM CHLORIDE 0.9 % IV SOLN
3.0000 g | Freq: Once | INTRAVENOUS | Status: AC
  Administered 2022-10-21: 3 g via INTRAVENOUS
  Filled 2022-10-21: qty 8

## 2022-10-21 NOTE — ED Triage Notes (Signed)
Pt complaining of rt lower extremity pain, cramping in the leg muscles and in the left arm muscles. He also is complaining of a tooth ache as well.

## 2022-10-21 NOTE — Progress Notes (Signed)
RLE venous has been completed.  Preliminary results given to Dr. Billy Fischer.   Results can be found under chart review under CV PROC. 10/21/2022 11:48 AM Jonathon Tan RVT, RDMS

## 2022-10-21 NOTE — ED Provider Notes (Signed)
  Physical Exam  BP (!) 149/108   Pulse 67   Temp 99.4 F (37.4 C)   Resp 18   Ht 6\' 2"  (1.88 m)   Wt 102.1 kg   SpO2 99%   BMI 28.89 kg/m   Physical Exam  Procedures  Procedures  ED Course / MDM    Medical Decision Making Amount and/or Complexity of Data Reviewed Labs: ordered. Radiology: ordered.  Risk Prescription drug management.   ***

## 2022-10-21 NOTE — ED Provider Notes (Signed)
[facility] DEPT Provider Note   CSN: [id number] Arrival date & time: 10/21/22  0241     History  Chief Complaint  Patient presents with   Dental Pain    Antonio Snow is a 61 y.o. male.  The history is provided by the patient.  Dental Pain Antonio Snow is a 61 y.o. male who presents to the Emergency Department complaining of sore throat and body aches.  He presents to the emergency department complaining of right lower posterior dental pain, sore throat for the last 6 weeks as well as body aches for the last day.  No associated fevers, shortness of breath, nausea, vomiting.  He states that he has only been able to tolerate yogurt for the last 3 days due to worsening pain and swelling.  He is not currently on any antibiotics and does not currently have a dentist.  No medications.  He smokes cigars, no alcohol.     Home Medications Prior to Admission medications   Medication Sig Start Date End Date Taking? Authorizing Provider  ibuprofen (ADVIL) 800 MG tablet Take 800 mg by mouth every 8 (eight) hours as needed.    [provider]  SUMAtriptan (IMITREX) 50 MG tablet Take 1 tablet (50 mg total) by mouth every 2 (two) hours as needed for migraine. May repeat once in 2 hours if headache persists or recurs. 06/20/22   Libby Maw, MD      Allergies    Patient has no known allergies.    Review of Systems   Review of Systems  All other systems reviewed and are negative.   Physical Exam Updated Vital Signs BP (!) 137/96 (BP Location: Right Arm)   Pulse 67   Temp 99.6 F (37.6 C) (Oral)   Resp 18   Ht 6\' 2"  (1.88 m)   Wt 102.1 kg   SpO2 99%   BMI 28.89 kg/m  Physical Exam Vitals and nursing note reviewed.  Constitutional:      Appearance: He is well-developed.  HENT:     Head: Normocephalic and atraumatic.     Comments: Poor dentition with multiple dental caries.  There is no significant soft tissue swelling in  the posterior oropharynx.  There is soft tissue swelling and tenderness over the right lower mandibular angle Cardiovascular:     Rate and Rhythm: Normal rate and regular rhythm.     Heart sounds: No murmur heard. Pulmonary:     Effort: Pulmonary effort is normal. No respiratory distress.     Breath sounds: Normal breath sounds.  Abdominal:     Palpations: Abdomen is soft.     Tenderness: There is no abdominal tenderness. There is no guarding or rebound.  Musculoskeletal:        General: No tenderness.  Skin:    General: Skin is warm and dry.  Neurological:     Mental Status: He is alert and oriented to person, place, and time.  Psychiatric:        Behavior: Behavior normal.     ED Results / Procedures / Treatments   Labs (all labs ordered are listed, but only abnormal results are displayed) Labs Reviewed  CBC WITH DIFFERENTIAL/PLATELET  BASIC METABOLIC PANEL    EKG None  Radiology No results found.  Procedures Procedures    Medications Ordered in ED Medications - No data to display  ED Course/ Medical Decision Making/ A&P  Medical Decision Making Amount and/or Complexity of Data Reviewed Labs: ordered. Radiology: ordered.   Patient here for evaluation of 6 weeks of progressive right-sided submandibular swelling and now with decreased oral intake for 3 days.  He does complain of associated cramping as well.  He does have soft tissue swelling with no focal drainable fluid collection palpated.  Given duration of symptoms plan to obtain CT scan to further evaluate.  Patient care transferred pending labs and imaging.        Final Clinical Impression(s) / ED Diagnoses Final diagnoses:  None    Rx / DC Orders ED Discharge Orders     None         Tilden Fossa, MD 10/21/22 [phone]

## 2022-10-21 NOTE — Discharge Instructions (Addendum)
For your pain, you may take up to 1000mg of acetaminophen (tylenol) 4 times daily for up to a week. This is the maximum dose of acetminophen (tylenol) you can take from all sources. Please check other over-the-counter medications and prescriptions to ensure you are not taking other medications that contain acetaminophen.  You may also take ibuprofen 400 mg 6 times a day OR 600mg 4 times a day alternating with or at the same time as tylenol.  Take oxycodone as needed for breakthrough pain.  This medication can be addicting, sedating and cause constipation.    

## 2022-10-23 ENCOUNTER — Telehealth: Payer: Self-pay

## 2022-10-23 NOTE — Telephone Encounter (Signed)
Transition Care Management Unsuccessful Follow-up Telephone Call  Date of discharge and from where:  10/21/22 [facility] ED. Dx: Cellulitis of the submandibular region, enlarged thyroid, dental infection, R leg pain.    Attempts:  1st Attempt  Reason for unsuccessful TCM follow-up call:  Left voice message

## 2022-10-24 NOTE — Telephone Encounter (Signed)
Transition Care Management Follow-up Telephone Call Date of discharge and from where: 10/14/22 [facility] ED. Cellulitis of the submandibular region, enlarged thyroid, dental infection, R leg pain.    How have you been since you were released from the hospital? I'm ok, I appreciate you all calling. Any questions or concerns? Yes  Items Reviewed: Did the pt receive and understand the discharge instructions provided? Yes  Medications obtained and verified? Yes  Other? No  Any new allergies since your discharge? No  Dietary orders reviewed? No Do you have support at home? Yes   Home Care and Equipment/Supplies: Were home health services ordered? not applicable If so, what is the name of the agency? N/A  Has the agency set up a time to come to the patient's home? not applicable Were any new equipment or medical supplies ordered?  No What is the name of the medical supply agency? N/A Were you able to get the supplies/equipment? not applicable Do you have any questions related to the use of the equipment or supplies? No  Functional Questionnaire: (I = Independent and D = Dependent) ADLs: I  Bathing/Dressing- I  Meal Prep- I  Eating- I  Maintaining continence- I  Transferring/Ambulation- I  Managing Meds- I  Follow up appointments reviewed:  PCP Hospital f/u appt confirmed? No  Scheduled to see N/A on N/A @ N/A. Mr. Weisgerber is currently uninsured and not able to sched an appt at this time. Ins will not be active until 12/2022. Instructed to contact us to sched when able to. [facility] f/u appt confirmed? No  Scheduled to see N/A on N/A @ N/A. Are transportation arrangements needed?  N/A If their condition worsens, is the pt aware to call PCP or go to the Emergency Dept.? Yes Was the patient provided with contact information for the PCP's office or ED? Yes Was to pt encouraged to call back with questions or concerns? Yes

## 2023-01-10 ENCOUNTER — Encounter (HOSPITAL_COMMUNITY): Payer: Self-pay | Admitting: Emergency Medicine

## 2023-01-10 ENCOUNTER — Ambulatory Visit (HOSPITAL_COMMUNITY)
Admission: EM | Admit: 2023-01-10 | Discharge: 2023-01-10 | Disposition: A | Discharge status: Home / Self Care | Payer: PRIVATE HEALTH INSURANCE | Attending: Family Medicine | Admitting: Family Medicine

## 2023-01-10 DIAGNOSIS — M25471 Effusion, right ankle: Secondary | ICD-10-CM | POA: Insufficient documentation

## 2023-01-10 DIAGNOSIS — M25571 Pain in right ankle and joints of right foot: Secondary | ICD-10-CM | POA: Diagnosis present

## 2023-01-10 DIAGNOSIS — K047 Periapical abscess without sinus: Secondary | ICD-10-CM | POA: Insufficient documentation

## 2023-01-10 LAB — BASIC METABOLIC PANEL
Anion gap: 11 (ref 5–15)
BUN: 15 mg/dL (ref 8–23)
CO2: 23 mmol/L (ref 22–32)
Calcium: 9.4 mg/dL (ref 8.9–10.3)
Chloride: 105 mmol/L (ref 98–111)
Creatinine, Ser: 1.27 mg/dL — ABNORMAL HIGH (ref 0.61–1.24)
GFR, Estimated: >60 mL/min (ref >60)
Glucose, Bld: 95 mg/dL (ref 70–99)
Potassium: 3.9 mmol/L (ref 3.5–5.1)
Sodium: 139 mmol/L (ref 135–145)

## 2023-01-10 LAB — CBC
HCT: 41.3 % (ref 39.0–52.0)
Hemoglobin: 12.9 g/dL — ABNORMAL LOW (ref 13.0–17.0)
MCH: 29.5 pg (ref 26.0–34.0)
MCHC: 31.2 g/dL (ref 30.0–36.0)
MCV: 94.5 fL (ref 80.0–100.0)
Platelets: 235 K/uL (ref 150–400)
RBC: 4.37 MIL/uL (ref 4.22–5.81)
RDW: 14 % (ref 11.5–15.5)
WBC: 7.5 K/uL (ref 4.0–10.5)
nRBC: 0 % (ref 0.0–0.2)

## 2023-01-10 LAB — URIC ACID: Uric Acid, Serum: 5.4 mg/dL (ref 3.7–8.6)

## 2023-01-10 MED ORDER — AMOXICILLIN-POT CLAVULANATE 875-125 MG PO TABS: 1.0000 | ORAL_TABLET | Freq: Two times a day (BID) | ORAL | 0 refills | Status: AC

## 2023-01-10 MED ORDER — PREDNISONE 20 MG PO TABS: 40.0000 mg | ORAL_TABLET | Freq: Every day | ORAL | 0 refills | Status: AC

## 2023-01-10 MED ORDER — HYDROCODONE-ACETAMINOPHEN 5-325 MG PO TABS: 1.0000 | ORAL_TABLET | Freq: Four times a day (QID) | ORAL | 0 refills | Status: AC | PRN

## 2023-01-10 MED ORDER — KETOROLAC TROMETHAMINE 30 MG/ML IJ SOLN
INTRAMUSCULAR | Status: AC
  Filled 2023-01-10: qty 1

## 2023-01-10 MED ORDER — KETOROLAC TROMETHAMINE 30 MG/ML IJ SOLN
30.0000 mg | Freq: Once | INTRAMUSCULAR | Status: AC
  Administered 2023-01-10: 30 mg via INTRAMUSCULAR

## 2023-01-10 NOTE — ED Triage Notes (Signed)
Pt reports had dental problems since August that cause problems in right leg. Reports that had a change in insurance since Jan 1. So unable to see someone for dental problems that will take insurance. Reports that had swelling right ankle for 3 weeks. Wearing compression socks. Denies injury

## 2023-01-10 NOTE — ED Provider Notes (Signed)
[facility]    CSN: [id number] Arrival date & time: 01/10/23  1351      History   Chief Complaint No chief complaint on file.   HPI Antonio Snow is a 62 y.o. male.   HPI For pain and infection and swelling around his teeth and also for right ankle swelling.  He states in October his dental infections got worse and his right ankle started swelling.  He was seen in the emergency room.  And he has been seen there again.  He has been prescribed clindamycin for his infection again on January 10.  He shows me that he still has swelling and drainage from dental abscesses.  He has had a venous Doppler that was negative.  There is not been a uric acid done so far; he just started trying to wear a compression sock  History reviewed. No pertinent past medical history.  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 No pertinent surgical history.     Home Medications    Prior to Admission medications   Medication Sig Start Date End Date Taking? Authorizing Provider  amoxicillin-clavulanate (AUGMENTIN) 875-125 MG tablet Take 1 tablet by mouth 2 (two) times daily for 7 days. 01/10/23 01/17/23 Yes Thijs Brunton, Janace Aris, MD  HYDROcodone-acetaminophen (NORCO/VICODIN) 5-325 MG tablet Take 1 tablet by mouth every 6 (six) hours as needed  (pain). 01/10/23  Yes Zenia Resides, MD  predniSONE (DELTASONE) 20 MG tablet Take 2 tablets (40 mg total) by mouth daily with breakfast for 5 days. 01/10/23 01/15/23 Yes Tinaya Ceballos, Janace Aris, MD    Family History Family History  Problem Relation Age of Onset   Cancer Mother    Cancer Father     Social History Social History   Tobacco Use   Smoking status: Light Smoker    Packs/day: 0.25    Types: Cigarettes   Smokeless tobacco: Never  Vaping Use   Vaping Use: Former  Substance Use Topics   Alcohol use: No   Drug use: No     Allergies   Patient has no known allergies.   Review of Systems Review of Systems   Physical Exam Triage Vital Signs ED Triage Vitals  Enc Vitals Group     BP 01/10/23 1619 (!) 142/85     Pulse Rate 01/10/23 1619 70     Resp 01/10/23 1619 17     Temp 01/10/23 1619 98 F (36.7 C)     Temp Source 01/10/23 1619 Oral     SpO2 01/10/23 1619 100 %     Weight --      Height --      Head Circumference --      Peak Flow --      Pain Score 01/10/23 1616 10     Pain Loc --      Pain Edu? --      Excl.  in [place]? --    No data found.  Updated Vital Signs BP (!) 142/85 (BP Location: Left Arm)   Pulse 70   Temp 98 F (36.7 C) (Oral)   Resp 17   SpO2 100%   Visual Acuity Right Eye Distance:   Left Eye Distance:   Bilateral Distance:    Right Eye Near:   Left Eye Near:    Bilateral Near:     Physical Exam Vitals reviewed.  Constitutional:      General: He is not in acute distress.    Appearance: He is normal weight. He is not ill-appearing, toxic-appearing or diaphoretic.  HENT:     Mouth/Throat:     Mouth: Mucous membranes are moist.     Comments: He has swollen areas with yellow exudate on them on the medial and lateral aspect of his left upper dental ridge and around his right wisdom tooth on the bottom.  The external cheek is not swollen Eyes:     Extraocular Movements: Extraocular movements intact.     Conjunctiva/sclera:  Conjunctivae normal.     Pupils: Pupils are equal, round, and reactive to light.  Cardiovascular:     Rate and Rhythm: Normal rate and regular rhythm.     Heart sounds: No murmur heard. Pulmonary:     Effort: Pulmonary effort is normal.     Breath sounds: Normal breath sounds.  Musculoskeletal:     Cervical back: Neck supple.     Comments: The right ankle is diffusely swollen and warm.  Pulse is normal in the dorsalis pedis area.  The forefoot is not that swollen.  There is no ulceration.  The left ankle is very slightly swollen.  Lymphadenopathy:     Cervical: No cervical adenopathy.  Skin:    Coloration: Skin is not jaundiced or pale.  Neurological:     General: No focal deficit present.     Mental Status: He is alert and oriented to person, place, and time.  Psychiatric:        Behavior: Behavior normal.      UC Treatments / Results  Labs (all labs ordered are listed, but only abnormal results are displayed) Labs Reviewed  BASIC METABOLIC PANEL  CBC  URIC ACID    EKG   Radiology No results found.  Procedures Procedures (including critical care time)  Medications Ordered in UC Medications  ketorolac (TORADOL) 30 MG/ML injection 30 mg (has no administration in time range)    Initial Impression / Assessment and Plan / UC Course  I have reviewed the triage vital signs and the nursing notes.  Pertinent labs & imaging results that were available during my care of the patient were reviewed by me and considered in my medical decision making (see chart for details).        I reviewed his labs from October.  I reviewed the CT scan from October that was benign and I reviewed his venous Doppler that was negative.  He has had a prescription that was small quantity of Tylenol 3 on January 10 and then oxycodone in October, also for a small quantity.  He is prescribed a small amount of hydrocodone from me number quantity 8.  I am putting him on prednisone in case this is  gout in his ankle.  He is given a shot of Toradol here and I am prescribing Augmentin for his infection.  He is given a list of low-cost dental providers   Final Clinical Impressions(s) / UC  Diagnoses   Final diagnoses:  Dental infection  Pain and swelling of right ankle     Discharge Instructions      Take amoxicillin-clavulanate 875 mg--1 tab twice daily with food for 7 days  Hydrocodone 5 mg--1 tablet every 6 hours as needed for pain.  This is best taken with food.  It can cause sleepiness or dizziness  Take prednisone 20 mg--2 daily for 5 days; this is for possible inflammation in the ankle.  You have been given a shot of Toradol 30 mg today.     ED Prescriptions     Medication Sig Dispense Auth. Provider   predniSONE (DELTASONE) 20 MG tablet Take 2 tablets (40 mg total) by mouth daily with breakfast for 5 days. 10 tablet Barrett Henle, MD   amoxicillin-clavulanate (AUGMENTIN) 875-125 MG tablet Take 1 tablet by mouth 2 (two) times daily for 7 days. 14 tablet Idalee Foxworthy, Gwenlyn Perking, MD   HYDROcodone-acetaminophen (NORCO/VICODIN) 5-325 MG tablet Take 1 tablet by mouth every 6 (six) hours as needed (pain). 8 tablet Dorrian Doggett, Gwenlyn Perking, MD      I have reviewed the PDMP during this encounter.   Barrett Henle, MD 01/10/23 [phone]

## 2023-01-10 NOTE — Discharge Instructions (Addendum)
Take amoxicillin-clavulanate 875 mg--1 tab twice daily with food for 7 days  Hydrocodone 5 mg--1 tablet every 6 hours as needed for pain.  This is best taken with food.  It can cause sleepiness or dizziness  Take prednisone 20 mg--2 daily for 5 days; this is for possible inflammation in the ankle.  You have been given a shot of Toradol 30 mg today.
